# Patient Record
Sex: Female | Born: 1975 | Race: Black or African American | Hispanic: No | Marital: Single | Smoking: Former smoker
Health system: Southern US, Community
[De-identification: ages and names within clinical notes are randomized; demographics above are authoritative.]

## PROBLEM LIST (undated history)

## (undated) HISTORY — PX: TUBAL LIGATION: SHX77

---

## 2005-07-07 ENCOUNTER — Emergency Department: Payer: Self-pay | Admitting: Emergency Medicine

## 2005-08-04 ENCOUNTER — Emergency Department: Payer: Self-pay | Admitting: Emergency Medicine

## 2005-12-17 ENCOUNTER — Emergency Department: Payer: Self-pay | Admitting: Emergency Medicine

## 2006-01-15 ENCOUNTER — Emergency Department: Payer: Self-pay | Admitting: Emergency Medicine

## 2006-04-30 ENCOUNTER — Emergency Department: Payer: Self-pay | Admitting: Emergency Medicine

## 2007-05-15 ENCOUNTER — Emergency Department: Payer: Self-pay | Admitting: Emergency Medicine

## 2007-05-25 ENCOUNTER — Emergency Department: Payer: Self-pay | Admitting: Emergency Medicine

## 2007-06-04 ENCOUNTER — Emergency Department: Payer: Self-pay | Admitting: Emergency Medicine

## 2008-03-01 ENCOUNTER — Emergency Department: Payer: Self-pay | Admitting: Emergency Medicine

## 2008-03-19 ENCOUNTER — Emergency Department: Payer: Self-pay | Admitting: Emergency Medicine

## 2008-03-27 ENCOUNTER — Emergency Department: Payer: Self-pay | Admitting: Emergency Medicine

## 2008-04-10 ENCOUNTER — Emergency Department: Payer: Self-pay | Admitting: Emergency Medicine

## 2008-05-20 ENCOUNTER — Emergency Department: Payer: Self-pay | Admitting: Emergency Medicine

## 2009-06-30 ENCOUNTER — Emergency Department: Payer: Self-pay | Admitting: Emergency Medicine

## 2009-12-19 ENCOUNTER — Emergency Department: Payer: Self-pay | Admitting: Unknown Physician Specialty

## 2009-12-28 ENCOUNTER — Emergency Department: Payer: Self-pay | Admitting: Emergency Medicine

## 2010-01-10 ENCOUNTER — Emergency Department: Payer: Self-pay | Admitting: Emergency Medicine

## 2010-05-23 ENCOUNTER — Emergency Department: Payer: Self-pay | Admitting: Internal Medicine

## 2010-05-30 ENCOUNTER — Emergency Department: Payer: Self-pay | Admitting: Emergency Medicine

## 2010-09-18 ENCOUNTER — Emergency Department: Payer: Self-pay | Admitting: Emergency Medicine

## 2010-09-25 ENCOUNTER — Emergency Department: Payer: Self-pay | Admitting: Emergency Medicine

## 2010-10-26 ENCOUNTER — Emergency Department: Payer: Self-pay | Admitting: Emergency Medicine

## 2011-11-21 ENCOUNTER — Emergency Department: Payer: Self-pay | Admitting: Unknown Physician Specialty

## 2012-03-31 ENCOUNTER — Emergency Department: Payer: Self-pay | Admitting: Emergency Medicine

## 2012-03-31 LAB — URINALYSIS, COMPLETE
Bilirubin,UR: NEGATIVE
Nitrite: NEGATIVE
Ph: 5 (ref 4.5–8.0)
RBC,UR: 3 /HPF (ref 0–5)
Specific Gravity: 1.029 (ref 1.003–1.030)
Squamous Epithelial: 9
WBC UR: 24 /HPF (ref 0–5)

## 2012-03-31 LAB — PREGNANCY, URINE: Pregnancy Test, Urine: NEGATIVE m[IU]/mL

## 2012-05-04 ENCOUNTER — Emergency Department: Payer: Self-pay | Admitting: Emergency Medicine

## 2012-05-04 LAB — PREGNANCY, URINE: Pregnancy Test, Urine: NEGATIVE m[IU]/mL

## 2012-05-04 LAB — URINALYSIS, COMPLETE
Glucose,UR: NEGATIVE mg/dL (ref 0–75)
Ketone: NEGATIVE
Nitrite: NEGATIVE
Ph: 6 (ref 4.5–8.0)
RBC,UR: 5 /HPF (ref 0–5)
Specific Gravity: 1.024 (ref 1.003–1.030)
Squamous Epithelial: 9
WBC UR: 78 /HPF (ref 0–5)

## 2012-05-06 LAB — BETA STREP CULTURE(ARMC)

## 2012-05-17 ENCOUNTER — Emergency Department: Payer: Self-pay | Admitting: Emergency Medicine

## 2012-05-17 LAB — URINALYSIS, COMPLETE
Bilirubin,UR: NEGATIVE
Ketone: NEGATIVE
Nitrite: NEGATIVE
RBC,UR: NONE SEEN /HPF (ref 0–5)
Specific Gravity: 1.025 (ref 1.003–1.030)
Squamous Epithelial: 31

## 2012-05-17 LAB — PREGNANCY, URINE: Pregnancy Test, Urine: NEGATIVE m[IU]/mL

## 2012-05-24 ENCOUNTER — Emergency Department: Payer: Self-pay | Admitting: Emergency Medicine

## 2012-05-25 LAB — MONONUCLEOSIS SCREEN: Mono Test: NEGATIVE

## 2012-07-16 ENCOUNTER — Emergency Department: Payer: Self-pay | Admitting: Emergency Medicine

## 2012-07-16 LAB — URINALYSIS, COMPLETE
Bilirubin,UR: NEGATIVE
Nitrite: NEGATIVE
Ph: 5 (ref 4.5–8.0)
Protein: NEGATIVE
Specific Gravity: 1.028 (ref 1.003–1.030)

## 2012-08-09 ENCOUNTER — Emergency Department: Payer: Self-pay | Admitting: *Deleted

## 2012-08-14 ENCOUNTER — Emergency Department: Payer: Self-pay | Admitting: Emergency Medicine

## 2012-08-18 LAB — WOUND CULTURE

## 2013-04-25 ENCOUNTER — Emergency Department: Payer: Self-pay | Admitting: Emergency Medicine

## 2013-04-29 ENCOUNTER — Emergency Department: Payer: Self-pay | Admitting: Emergency Medicine

## 2013-09-11 ENCOUNTER — Emergency Department: Payer: Self-pay | Admitting: Emergency Medicine

## 2013-11-30 ENCOUNTER — Emergency Department: Payer: Self-pay | Admitting: Emergency Medicine

## 2013-11-30 LAB — URINALYSIS, COMPLETE
Ketone: NEGATIVE
Ph: 6 (ref 4.5–8.0)
Specific Gravity: 1.023 (ref 1.003–1.030)
Squamous Epithelial: 10
WBC UR: 52 /HPF (ref 0–5)

## 2014-01-31 ENCOUNTER — Emergency Department: Payer: Self-pay | Admitting: Emergency Medicine

## 2014-01-31 LAB — URINALYSIS, COMPLETE
BACTERIA: NONE SEEN
Bilirubin,UR: NEGATIVE
Blood: NEGATIVE
GLUCOSE, UR: NEGATIVE mg/dL (ref 0–75)
Ketone: NEGATIVE
NITRITE: POSITIVE
PH: 6 (ref 4.5–8.0)
Protein: 100
RBC,UR: 18 /HPF (ref 0–5)
Specific Gravity: 1.021 (ref 1.003–1.030)

## 2014-03-11 ENCOUNTER — Emergency Department: Payer: Self-pay | Admitting: Internal Medicine

## 2014-03-11 LAB — COMPREHENSIVE METABOLIC PANEL
ALBUMIN: 3.5 g/dL (ref 3.4–5.0)
ALT: 18 U/L (ref 12–78)
Alkaline Phosphatase: 72 U/L
Anion Gap: 2 — ABNORMAL LOW (ref 7–16)
BUN: 8 mg/dL (ref 7–18)
Bilirubin,Total: 0.4 mg/dL (ref 0.2–1.0)
CHLORIDE: 105 mmol/L (ref 98–107)
Calcium, Total: 8.6 mg/dL (ref 8.5–10.1)
Co2: 29 mmol/L (ref 21–32)
Creatinine: 0.82 mg/dL (ref 0.60–1.30)
EGFR (African American): >60
EGFR (Non-African Amer.): >60
GLUCOSE: 97 mg/dL (ref 65–99)
Osmolality: 270 (ref 275–301)
Potassium: 3.9 mmol/L (ref 3.5–5.1)
SGOT(AST): 11 U/L — ABNORMAL LOW (ref 15–37)
SODIUM: 136 mmol/L (ref 136–145)
TOTAL PROTEIN: 8.2 g/dL (ref 6.4–8.2)

## 2014-03-11 LAB — URINALYSIS, COMPLETE
Bilirubin,UR: NEGATIVE
Glucose,UR: NEGATIVE mg/dL (ref 0–75)
KETONE: NEGATIVE
Nitrite: POSITIVE
PH: 7 (ref 4.5–8.0)
RBC,UR: 33 /HPF (ref 0–5)
SPECIFIC GRAVITY: 1.02 (ref 1.003–1.030)
Squamous Epithelial: 7
WBC UR: 274 /HPF (ref 0–5)

## 2014-03-11 LAB — CBC
HCT: 36.4 % (ref 35.0–47.0)
HGB: 12.1 g/dL (ref 12.0–16.0)
MCH: 26.6 pg (ref 26.0–34.0)
MCHC: 33.3 g/dL (ref 32.0–36.0)
MCV: 80 fL (ref 80–100)
PLATELETS: 340 10*3/uL (ref 150–440)
RBC: 4.56 10*6/uL (ref 3.80–5.20)
RDW: 14.4 % (ref 11.5–14.5)
WBC: 9.2 10*3/uL (ref 3.6–11.0)

## 2014-05-28 ENCOUNTER — Emergency Department: Payer: Self-pay | Admitting: Internal Medicine

## 2014-05-28 LAB — COMPREHENSIVE METABOLIC PANEL
Albumin: 3.2 g/dL — ABNORMAL LOW (ref 3.4–5.0)
Alkaline Phosphatase: 80 U/L
Anion Gap: 6 — ABNORMAL LOW (ref 7–16)
BUN: 7 mg/dL (ref 7–18)
Bilirubin,Total: 0.4 mg/dL (ref 0.2–1.0)
CHLORIDE: 105 mmol/L (ref 98–107)
CO2: 27 mmol/L (ref 21–32)
CREATININE: 0.71 mg/dL (ref 0.60–1.30)
Calcium, Total: 8.6 mg/dL (ref 8.5–10.1)
EGFR (African American): >60
EGFR (Non-African Amer.): >60
Glucose: 90 mg/dL (ref 65–99)
OSMOLALITY: 273 (ref 275–301)
POTASSIUM: 3.9 mmol/L (ref 3.5–5.1)
SGOT(AST): 9 U/L — ABNORMAL LOW (ref 15–37)
SGPT (ALT): 15 U/L (ref 12–78)
Sodium: 138 mmol/L (ref 136–145)
Total Protein: 7.3 g/dL (ref 6.4–8.2)

## 2014-05-28 LAB — CBC
HCT: 35.5 % (ref 35.0–47.0)
HGB: 11.7 g/dL — AB (ref 12.0–16.0)
MCH: 26 pg (ref 26.0–34.0)
MCHC: 33 g/dL (ref 32.0–36.0)
MCV: 79 fL — ABNORMAL LOW (ref 80–100)
PLATELETS: 325 10*3/uL (ref 150–440)
RBC: 4.5 10*6/uL (ref 3.80–5.20)
RDW: 14.2 % (ref 11.5–14.5)
WBC: 7.9 10*3/uL (ref 3.6–11.0)

## 2014-05-28 LAB — URINALYSIS, COMPLETE
BILIRUBIN, UR: NEGATIVE
Bacteria: NONE SEEN
Blood: NEGATIVE
GLUCOSE, UR: NEGATIVE mg/dL (ref 0–75)
KETONE: NEGATIVE
Nitrite: NEGATIVE
PH: 7 (ref 4.5–8.0)
RBC,UR: 27 /HPF (ref 0–5)
Specific Gravity: 1.021 (ref 1.003–1.030)
WBC UR: 118 /HPF (ref 0–5)

## 2014-06-27 ENCOUNTER — Emergency Department: Payer: Self-pay | Admitting: Emergency Medicine

## 2014-06-27 LAB — URINALYSIS, COMPLETE
BLOOD: NEGATIVE
Bilirubin,UR: NEGATIVE
Glucose,UR: NEGATIVE mg/dL (ref 0–75)
Ketone: NEGATIVE
NITRITE: NEGATIVE
PH: 6 (ref 4.5–8.0)
Protein: NEGATIVE
Specific Gravity: 1.024 (ref 1.003–1.030)

## 2014-08-09 ENCOUNTER — Emergency Department: Payer: Self-pay | Admitting: Internal Medicine

## 2014-09-15 ENCOUNTER — Emergency Department: Payer: Self-pay | Admitting: Emergency Medicine

## 2015-06-12 ENCOUNTER — Emergency Department
Admission: EM | Admit: 2015-06-12 | Discharge: 2015-06-12 | Disposition: A | Discharge status: Home / Self Care | Payer: No Typology Code available for payment source | Attending: Student | Admitting: Student

## 2015-06-12 DIAGNOSIS — N390 Urinary tract infection, site not specified: Secondary | ICD-10-CM | POA: Diagnosis not present

## 2015-06-12 DIAGNOSIS — L089 Local infection of the skin and subcutaneous tissue, unspecified: Secondary | ICD-10-CM | POA: Diagnosis not present

## 2015-06-12 DIAGNOSIS — Z3202 Encounter for pregnancy test, result negative: Secondary | ICD-10-CM | POA: Insufficient documentation

## 2015-06-12 DIAGNOSIS — R350 Frequency of micturition: Secondary | ICD-10-CM | POA: Diagnosis present

## 2015-06-12 LAB — URINALYSIS COMPLETE WITH MICROSCOPIC (ARMC ONLY)
Bilirubin Urine: NEGATIVE
GLUCOSE, UA: NEGATIVE mg/dL
Ketones, ur: NEGATIVE mg/dL
NITRITE: POSITIVE — AB
Protein, ur: NEGATIVE mg/dL
Specific Gravity, Urine: 1.025 (ref 1.005–1.030)
pH: 6 (ref 5.0–8.0)

## 2015-06-12 LAB — POCT PREGNANCY, URINE: PREG TEST UR: NEGATIVE

## 2015-06-12 MED ORDER — SULFAMETHOXAZOLE-TRIMETHOPRIM 800-160 MG PO TABS
ORAL_TABLET | ORAL | Status: AC
  Administered 2015-06-12: 1 via ORAL
  Filled 2015-06-12: qty 1

## 2015-06-12 MED ORDER — SULFAMETHOXAZOLE-TRIMETHOPRIM 800-160 MG PO TABS
1.0000 | ORAL_TABLET | Freq: Two times a day (BID) | ORAL | Status: DC
  Administered 2015-06-12: 1 via ORAL

## 2015-06-12 MED ORDER — IBUPROFEN 800 MG PO TABS
800.0000 mg | ORAL_TABLET | Freq: Once | ORAL | Status: AC
  Administered 2015-06-12: 800 mg via ORAL

## 2015-06-12 MED ORDER — PHENAZOPYRIDINE HCL 200 MG PO TABS
200.0000 mg | ORAL_TABLET | Freq: Once | ORAL | Status: AC
  Administered 2015-06-12: 200 mg via ORAL

## 2015-06-12 MED ORDER — PHENAZOPYRIDINE HCL 200 MG PO TABS: 200.0000 mg | ORAL_TABLET | Freq: Three times a day (TID) | ORAL | Status: AC | PRN

## 2015-06-12 MED ORDER — IBUPROFEN 800 MG PO TABS
ORAL_TABLET | ORAL | Status: AC
  Administered 2015-06-12: 800 mg via ORAL
  Filled 2015-06-12: qty 1

## 2015-06-12 MED ORDER — SULFAMETHOXAZOLE-TRIMETHOPRIM 800-160 MG PO TABS: 1.0000 | ORAL_TABLET | Freq: Two times a day (BID) | ORAL | Status: DC

## 2015-06-12 MED ORDER — PHENAZOPYRIDINE HCL 200 MG PO TABS
ORAL_TABLET | ORAL | Status: AC
  Administered 2015-06-12: 200 mg via ORAL
  Filled 2015-06-12: qty 1

## 2015-06-12 MED ORDER — IBUPROFEN 800 MG PO TABS: 800.0000 mg | ORAL_TABLET | Freq: Three times a day (TID) | ORAL | Status: DC | PRN

## 2015-06-12 NOTE — ED Notes (Signed)
Pt c/o swollen area to the right upper lateral leg for the past week with drainage, "I think I was bit by a spider'.

## 2015-06-12 NOTE — ED Provider Notes (Signed)
[facility] Emergency Department Provider Note  ____________________________________________  Time seen: 1138   I have reviewed the triage vital signs and the nursing notes.   HISTORY  Chief Complaint Abscess    HPI Rachel Cline is a 39 y.o. female comes here today with an area on her right thigh that she believes to be a spider bite she is also having burning with urination frequency urgency and bad smell overall rates her discomfort as about a 6 out of 10 nothing seems to make it better or worse and no other symptoms of note at this time here today for further evaluation and treatment if needed   No past medical history on file.  There are no active problems to display for this patient.   No past surgical history on file.  Current Outpatient Rx  Name  Route  Sig  Dispense  Refill  . ibuprofen (ADVIL,MOTRIN) 800 MG tablet   Oral   Take 1 tablet (800 mg total) by mouth every 8 (eight) hours as needed.   30 tablet   0   . phenazopyridine (PYRIDIUM) 200 MG tablet   Oral   Take 1 tablet (200 mg total) by mouth 3 (three) times daily as needed for pain.   10 tablet   0   . sulfamethoxazole-trimethoprim (BACTRIM DS,SEPTRA DS) 800-160 MG per tablet   Oral   Take 1 tablet by mouth 2 (two) times daily.   14 tablet   0     Allergies Review of patient's allergies indicates no known allergies.  No family history on file.  Social History History  Substance Use Topics  . Smoking status: Not on file  . Smokeless tobacco: Not on file  . Alcohol Use: Not on file    Review of Systems Constitutional: No fever/chills Eyes: No visual changes. ENT: No sore throat. Cardiovascular: Denies chest pain. Respiratory: Denies shortness of breath. Gastrointestinal: No abdominal pain.  No nausea, no vomiting.  No diarrhea.  No constipation. Genitourinary: Negative for dysuria. Musculoskeletal: Negative for back pain. Skin: Negative for  rash. Neurological: Negative for headaches, focal weakness or numbness.  10-point ROS otherwise negative.  ____________________________________________   PHYSICAL EXAM:  VITAL SIGNS: ED Triage Vitals  Enc Vitals Group     BP 06/12/15 1117 143/98 mmHg     Pulse Rate 06/12/15 1117 70     Resp 06/12/15 1117 18     Temp 06/12/15 1117 98.9 F (37.2 C)     Temp Source 06/12/15 1117 Oral     SpO2 06/12/15 1117 98 %     Weight 06/12/15 1117 250 lb (113.399 kg)     Height 06/12/15 1117  (1.676 m)     Head Cir --      Peak Flow --      Pain Score 06/12/15 1118 6     Pain Loc --      Pain Edu? --      Excl. in GC? --     Constitutional: Alert and oriented. Well appearing and in no acute distress. Eyes: Conjunctivae are normal. PERRL. EOMI. Head: Atraumatic. Nose: No congestion/rhinnorhea. Mouth/Throat: Mucous membranes are moist.  Oropharynx non-erythematous. Neck: No stridor.   Cardiovascular: Normal rate, regular rhythm. Grossly normal heart sounds.  Good peripheral circulation. Respiratory: Normal respiratory effort.  No retractions. Lungs CTAB. Gastrointestinal: Soft and nontender. No distention. No abdominal bruits. No CVA tenderness. Musculoskeletal: No lower extremity tenderness nor edema.  No joint effusions. Neurologic:  Normal speech  and language. No gross focal neurologic deficits are appreciated. Speech is normal. No gait instability. Skin:  Has a swollen tender area to her right thigh no redness or fluctuance seems to be well-healing Psychiatric: Mood and affect are normal. Speech and behavior are normal.  ____________________________________________   LABS (all labs ordered are listed, but only abnormal results are displayed)  Labs Reviewed  URINALYSIS COMPLETEWITH MICROSCOPIC ([facility] ONLY) - Abnormal; Notable for the following:    Color, Urine YELLOW (*)    APPearance CLOUDY (*)    Hgb urine dipstick 1+ (*)    Nitrite POSITIVE (*)    Leukocytes, UA 1+  (*)    Bacteria, UA FEW (*)    Squamous Epithelial / LPF 6-30 (*)    All other components within normal limits  POC URINE PREG, ED  POCT PREGNANCY, URINE     PROCEDURES  Procedure(s) performed: None  Critical Care performed: No  ____________________________________________   INITIAL IMPRESSION / ASSESSMENT AND PLAN / ED COURSE  Pertinent labs & imaging results that were available during my care of the patient were reviewed by me and considered in my medical decision making (see chart for details).  Initial impression on this patient healing soft tissue infection of the right thigh and urinary tract infection will start patient on Bactrim Motrin Pyridium have a follow-up with her regular doctor as needed return here for any acute concerns or worsening symptoms ____________________________________________   FINAL CLINICAL IMPRESSION(S) / ED DIAGNOSES  Final diagnoses:  UTI (lower urinary tract infection)  Soft tissue infection     Shannan Slinker Rosalyn GessWilliam C Jesseka Drinkard, PA-C 06/12/15 1242  Gayla DossEryka A Gayle, MD 06/12/15 1526

## 2015-06-25 ENCOUNTER — Encounter: Payer: Self-pay | Admitting: Medical Oncology

## 2015-06-25 ENCOUNTER — Other Ambulatory Visit: Payer: Self-pay

## 2015-06-25 ENCOUNTER — Emergency Department
Admission: EM | Admit: 2015-06-25 | Discharge: 2015-06-25 | Disposition: A | Discharge status: Home / Self Care | Payer: No Typology Code available for payment source | Attending: Emergency Medicine | Admitting: Emergency Medicine

## 2015-06-25 DIAGNOSIS — R519 Headache, unspecified: Secondary | ICD-10-CM

## 2015-06-25 DIAGNOSIS — F419 Anxiety disorder, unspecified: Secondary | ICD-10-CM | POA: Diagnosis not present

## 2015-06-25 DIAGNOSIS — R42 Dizziness and giddiness: Secondary | ICD-10-CM

## 2015-06-25 DIAGNOSIS — Z792 Long term (current) use of antibiotics: Secondary | ICD-10-CM | POA: Insufficient documentation

## 2015-06-25 DIAGNOSIS — F42 Obsessive-compulsive disorder: Secondary | ICD-10-CM | POA: Diagnosis present

## 2015-06-25 DIAGNOSIS — R51 Headache: Secondary | ICD-10-CM | POA: Insufficient documentation

## 2015-06-25 LAB — CBC
HCT: 34.2 % — ABNORMAL LOW (ref 35.0–47.0)
Hemoglobin: 11.1 g/dL — ABNORMAL LOW (ref 12.0–16.0)
MCH: 25.3 pg — ABNORMAL LOW (ref 26.0–34.0)
MCHC: 32.3 g/dL (ref 32.0–36.0)
MCV: 78.4 fL — AB (ref 80.0–100.0)
Platelets: 332 10*3/uL (ref 150–440)
RBC: 4.37 MIL/uL (ref 3.80–5.20)
RDW: 14.7 % — AB (ref 11.5–14.5)
WBC: 8.7 10*3/uL (ref 3.6–11.0)

## 2015-06-25 LAB — BASIC METABOLIC PANEL
ANION GAP: 8 (ref 5–15)
BUN: 14 mg/dL (ref 6–20)
CALCIUM: 8.8 mg/dL — AB (ref 8.9–10.3)
CO2: 26 mmol/L (ref 22–32)
Chloride: 104 mmol/L (ref 101–111)
Creatinine, Ser: 0.88 mg/dL (ref 0.44–1.00)
GFR calc Af Amer: >60 mL/min (ref >60)
GFR calc non Af Amer: >60 mL/min (ref >60)
GLUCOSE: 90 mg/dL (ref 65–99)
Potassium: 4 mmol/L (ref 3.5–5.1)
Sodium: 138 mmol/L (ref 135–145)

## 2015-06-25 MED ORDER — KETOROLAC TROMETHAMINE 30 MG/ML IJ SOLN
INTRAMUSCULAR | Status: AC
  Filled 2015-06-25: qty 1

## 2015-06-25 MED ORDER — SODIUM CHLORIDE 0.9 % IV SOLN
1000.0000 mL | Freq: Once | INTRAVENOUS | Status: AC
Start: 2015-06-25 — End: 2015-06-25
  Administered 2015-06-25: 1000 mL via INTRAVENOUS

## 2015-06-25 MED ORDER — KETOROLAC TROMETHAMINE 30 MG/ML IJ SOLN
30.0000 mg | Freq: Once | INTRAMUSCULAR | Status: AC
  Administered 2015-06-25: 30 mg via INTRAVENOUS

## 2015-06-25 NOTE — Discharge Instructions (Signed)

## 2015-06-25 NOTE — ED Provider Notes (Signed)
[facility] Emergency Department Provider Note  ____________________________________________  Time seen: On arrival  I have reviewed the triage vital signs and the nursing notes.   HISTORY  Chief Complaint Dizziness and Headache    HPI Rachel Cline is a 39 y.o. female who presents with complaints of lightheadedness and headache that started yesterday but has improved. She reports headache was global and throbbing in nature and occurred after an episode of lightheadedness which resolved with sitting down. She denies fevers chills. No neck pain. This is not the worst headache of her life in fact is improved immensely. No focal deficits. No IV drug abuse.No sick contacts, no visual changes     History reviewed. No pertinent past medical history.  There are no active problems to display for this patient.   Past Surgical History  Procedure Laterality Date  . Cesarean section    . Tubal ligation      Current Outpatient Rx  Name  Route  Sig  Dispense  Refill  . ibuprofen (ADVIL,MOTRIN) 200 MG tablet   Oral   Take 600 mg by mouth every 6 (six) hours as needed for headache.         . phenazopyridine (PYRIDIUM) 200 MG tablet   Oral   Take 1 tablet (200 mg total) by mouth 3 (three) times daily as needed for pain.   10 tablet   0   . sulfamethoxazole-trimethoprim (BACTRIM DS,SEPTRA DS) 800-160 MG per tablet   Oral   Take 1 tablet by mouth 2 (two) times daily.   14 tablet   0   . ibuprofen (ADVIL,MOTRIN) 800 MG tablet   Oral   Take 1 tablet (800 mg total) by mouth every 8 (eight) hours as needed.   30 tablet   0     Allergies Review of patient's allergies indicates no known allergies.  No family history on file.  Social History History  Substance Use Topics  . Smoking status: Never Smoker   . Smokeless tobacco: Not on file  . Alcohol Use: Yes     Comment: occ    Review of Systems  Constitutional: Negative for fever. Eyes:  Negative for visual changes. ENT: Negative for sore throat Cardiovascular: Negative for chest pain. Respiratory: Negative for shortness of breath. Gastrointestinal: Negative for abdominal pain, vomiting and diarrhea. Genitourinary: Negative for dysuria. Musculoskeletal: Negative for back pain. Skin: Negative for rash. Neurological: Positive for headache Psychiatric: Positive for anxiety  10-point ROS otherwise negative.  ____________________________________________   PHYSICAL EXAM:  VITAL SIGNS: ED Triage Vitals  Enc Vitals Group     BP 06/25/15 0923 136/100 mmHg     Pulse Rate 06/25/15 0923 65     Resp 06/25/15 0923 14     Temp --      Temp src --      SpO2 06/25/15 0923 97 %     Weight --      Height --      Head Cir --      Peak Flow --      Pain Score 06/25/15 0842 8     Pain Loc --      Pain Edu? --      Excl. in GC? --      Constitutional: Alert and oriented. Well appearing and in no distress. Eyes: Conjunctivae are normal.  ENT   Head: Normocephalic and atraumatic.   Mouth/Throat: Mucous membranes are moist. Cardiovascular: Normal rate, regular rhythm. Normal and symmetric distal pulses are  present in all extremities. No murmurs, rubs, or gallops. Respiratory: Normal respiratory effort without tachypnea nor retractions. Breath sounds are clear and equal bilaterally.  Gastrointestinal: Soft and non-tender in all quadrants. No distention. There is no CVA tenderness. Genitourinary: deferred Musculoskeletal: Nontender with normal range of motion in all extremities. No lower extremity tenderness nor edema. Neurologic:  Normal speech and language. No gross focal neurologic deficits are appreciated. Skin:  Skin is warm, dry and intact. No rash noted. Psychiatric: Mood and affect are normal. Patient exhibits appropriate insight and judgment.  ____________________________________________    LABS (pertinent positives/negatives)  Labs Reviewed  CBC -  Abnormal; Notable for the following:    Hemoglobin 11.1 (*)    HCT 34.2 (*)    MCV 78.4 (*)    MCH 25.3 (*)    RDW 14.7 (*)    All other components within normal limits  BASIC METABOLIC PANEL - Abnormal; Notable for the following:    Calcium 8.8 (*)    All other components within normal limits  POC URINE PREG, ED  CBG MONITORING, ED    ____________________________________________   EKG  ED ECG REPORT I, Jene Every, the attending physician, personally viewed and interpreted this ECG.  Date: 06/25/2015 EKG Time: 8:48 AM Rate: 70 Rhythm: normal sinus rhythm QRS Axis: normal Intervals: normal ST/T Wave abnormalities: normal Conduction Disutrbances: none Narrative Interpretation: unremarkable   ____________________________________________    RADIOLOGY I have personally reviewed any xrays that were ordered on this patient: None  ____________________________________________   PROCEDURES  Procedure(s) performed: none  Critical Care performed: none  ____________________________________________   INITIAL IMPRESSION / ASSESSMENT AND PLAN / ED COURSE  Pertinent labs & imaging results that were available during my care of the patient were reviewed by me and considered in my medical decision making (see chart for details).  Patient extremely well-appearing and in no acute distress. Vital signs normal. Benign exam. Normal fundus bilaterally. We will treat with IV fluids and IV Toradol after verifying negative pregnancy(patient just finished normal menstrual cycle).  ----------------------------------------- 11:31 AM on 06/25/2015 -----------------------------------------  Patient improved after the Toradol and anxious to go home. Hemoglobin not significant change from prior test. Recommend follow-up with PCP. Return precautions given ____________________________________________   FINAL CLINICAL IMPRESSION(S) / ED DIAGNOSES  Final diagnoses:  Acute  nonintractable headache, unspecified headache type  Dizziness     Jene Every, MD 06/25/15 1132

## 2015-06-25 NOTE — ED Notes (Signed)
Pt ambulatory to triage with reports that she began feeling light headed yesterday with headache, has continued to worsen.

## 2016-10-22 ENCOUNTER — Emergency Department
Admission: EM | Admit: 2016-10-22 | Discharge: 2016-10-22 | Disposition: A | Discharge status: Home / Self Care | Payer: No Typology Code available for payment source | Attending: Emergency Medicine | Admitting: Emergency Medicine

## 2016-10-22 DIAGNOSIS — M722 Plantar fascial fibromatosis: Secondary | ICD-10-CM | POA: Insufficient documentation

## 2016-10-22 MED ORDER — METHYLPREDNISOLONE SODIUM SUCC 125 MG IJ SOLR
125.0000 mg | Freq: Once | INTRAMUSCULAR | Status: AC
  Administered 2016-10-22: 125 mg via INTRAVENOUS
  Filled 2016-10-22: qty 2

## 2016-10-22 MED ORDER — METHYLPREDNISOLONE 4 MG PO TBPK: ORAL_TABLET | ORAL | 0 refills | Status: DC

## 2016-10-22 NOTE — ED Notes (Signed)
States she is having pain to bottom of right foot    Pain is mainly at heel and radiates into back of leg  Pain is worse when first getting up in the mornings  Denies any injury

## 2016-10-22 NOTE — ED Provider Notes (Signed)
[facility] Emergency Department Provider Note   ____________________________________________   None    (approximate)  I have reviewed the triage vital signs and the nursing notes.   HISTORY  Chief Complaint Ankle Pain    HPI Rachel Cline is a 40 y.o. female patient complain of posterior right ankle pain and it radiates to the knees. Patient state onset 2-3 days. Patient denies any injury. Patient stated pain is worse in the morning and improves stone in the afternoon. Patient rates the pain as a 8/10. Patient described a pain as "achy". No palliative measures taken for this complaint.  History reviewed. No pertinent past medical history.  There are no active problems to display for this patient.   Past Surgical History:  Procedure Laterality Date  . CESAREAN SECTION    . TUBAL LIGATION      Prior to Admission medications   Medication Sig Start Date End Date Taking? Authorizing Provider  methylPREDNISolone (MEDROL DOSEPAK) 4 MG TBPK tablet Take Tapered dose as directed 10/22/16   Joni Reiningonald K Smith, PA-C    Allergies Patient has no known allergies.  No family history on file.  Social History Social History  Substance Use Topics  . Smoking status: Never Smoker  . Smokeless tobacco: Never Used  . Alcohol use Yes     Comment: occ    Review of Systems Constitutional: No fever/chills Eyes: No visual changes. ENT: No sore throat. Cardiovascular: Denies chest pain. Respiratory: Denies shortness of breath. Gastrointestinal: No abdominal pain.  No nausea, no vomiting.  No diarrhea.  No constipation. Genitourinary: Negative for dysuria. Musculoskeletal: Right posterior heel pain Skin: Negative for rash. Neurological: Negative for headaches, focal weakness or numbness. .  ____________________________________________   PHYSICAL EXAM:  VITAL SIGNS: ED Triage Vitals  Enc Vitals Group     BP 10/22/16 0828 (!) 144/87     Pulse Rate  10/22/16 0827 85     Resp 10/22/16 0827 18     Temp 10/22/16 0827 97.8 F (36.6 C)     Temp Source 10/22/16 0827 Oral     SpO2 10/22/16 0827 99 %     Weight 10/22/16 0826 230 lb (104.3 kg)     Height 10/22/16 0826 5\' 6"  (1.676 m)     Head Circumference --      Peak Flow --      Pain Score 10/22/16 0826 8     Pain Loc --      Pain Edu? --      Excl. in GC? --     Constitutional: Alert and oriented. Well appearing and in no acute distress. Eyes: Conjunctivae are normal. PERRL. EOMI. Head: Atraumatic. Nose: No congestion/rhinnorhea. Mouth/Throat: Mucous membranes are moist.  Oropharynx non-erythematous. Neck: No stridor.  No cervical spine tenderness to palpation. Hematological/Lymphatic/Immunilogical: No cervical lymphadenopathy. }Cardiovascular: Normal rate, regular rhythm. Grossly normal heart sounds.  Good peripheral circulation. Respiratory: Normal respiratory effort.  No retractions. Lungs CTAB. Gastrointestinal: Soft and nontender. No distention. No abdominal bruits. No CVA tenderness. Genitourinary:  Musculoskeletal:Bilateral bilateral Pes Planus. Moderate guarding palpation of the plantar aspect of the heel.  Neurologic:  Normal speech and language. No gross focal neurologic deficits are appreciated. No gait instability. Skin:  Skin is warm, dry and intact. No rash noted. Psychiatric: Mood and affect are normal. Speech and behavior are normal.  ____________________________________________   LABS (all labs ordered are listed, but only abnormal results are displayed)  Labs Reviewed - No data to display ____________________________________________  EKG   ____________________________________________  RADIOLOGY   ____________________________________________   PROCEDURES  Procedure(s) performed: None  Procedures  Critical Care performed: No  ____________________________________________   INITIAL IMPRESSION / ASSESSMENT AND PLAN / ED COURSE  Pertinent  labs & imaging results that were available during my care of the patient were reviewed by me and considered in my medical decision making (see chart for details).  Plantar fasciitis. Patient given discharge care instructions. Patient given a prescription for Medrol Dosepak. Patient follow up with podiatry for definitive evaluation and treatment.  Clinical Course      ____________________________________________   FINAL CLINICAL IMPRESSION(S) / ED DIAGNOSES  Final diagnoses:  Plantar fasciitis      NEW MEDICATIONS STARTED DURING THIS VISIT:  New Prescriptions   METHYLPREDNISOLONE (MEDROL DOSEPAK) 4 MG TBPK TABLET    Take Tapered dose as directed     Note:  This document was prepared using Dragon voice recognition software and may include unintentional dictation errors.    Joni Reiningonald K Smith, PA-C 10/22/16 [id number]    Jene Everyobert Kinner, MD 10/22/16 [phone]

## 2016-10-22 NOTE — ED Triage Notes (Signed)
Pt c/o right ankle pain that radiates into the knee for the past 2-3 days.. Denies injury..Marland Kitchen

## 2017-03-14 ENCOUNTER — Emergency Department
Admission: EM | Admit: 2017-03-14 | Discharge: 2017-03-14 | Disposition: A | Discharge status: Home / Self Care | Payer: No Typology Code available for payment source | Attending: Emergency Medicine | Admitting: Emergency Medicine

## 2017-03-14 DIAGNOSIS — R030 Elevated blood-pressure reading, without diagnosis of hypertension: Secondary | ICD-10-CM | POA: Insufficient documentation

## 2017-03-14 DIAGNOSIS — K029 Dental caries, unspecified: Secondary | ICD-10-CM | POA: Insufficient documentation

## 2017-03-14 DIAGNOSIS — N3000 Acute cystitis without hematuria: Secondary | ICD-10-CM | POA: Insufficient documentation

## 2017-03-14 LAB — URINALYSIS, COMPLETE (UACMP) WITH MICROSCOPIC
BILIRUBIN URINE: NEGATIVE
Glucose, UA: NEGATIVE mg/dL
KETONES UR: NEGATIVE mg/dL
Nitrite: NEGATIVE
Protein, ur: 30 mg/dL — AB
Specific Gravity, Urine: 1.018 (ref 1.005–1.030)
pH: 7 (ref 5.0–8.0)

## 2017-03-14 LAB — PREGNANCY, URINE: Preg Test, Ur: NEGATIVE

## 2017-03-14 MED ORDER — IBUPROFEN 600 MG PO TABS: 600.0000 mg | ORAL_TABLET | Freq: Three times a day (TID) | ORAL | 0 refills | Status: DC | PRN

## 2017-03-14 MED ORDER — CEPHALEXIN 500 MG PO CAPS: 500.0000 mg | ORAL_CAPSULE | Freq: Three times a day (TID) | ORAL | 0 refills | Status: DC

## 2017-03-14 NOTE — ED Notes (Signed)
Patient states that she fell down 4 steps "on her butt" c/o bilateral low back pain. Patient states that she is also concerned about pain in the bilateral upper gums pertaining to #6 & #11 (canines). Patient states that it will 'swell then go down'. Patient states she has an appointment at [facility] clinic Tuesday.

## 2017-03-14 NOTE — ED Triage Notes (Addendum)
Pt fell last night and is now co low back pain, also has toothache. Pt also co urinary frequency.

## 2017-03-14 NOTE — ED Provider Notes (Signed)
[facility] Emergency Department Provider Note  ____________________________________________   First MD Initiated Contact with Patient 03/14/17 [phone]     (approximate)  I have reviewed the triage vital signs and the nursing notes.   HISTORY  Chief Complaint Back Pain    HPI Rachel Cline is a 41 y.o. female is complaining of low back pain and urinary frequency. Patient states it is been approximately 2-3 years that she has had a urinary tract infection. She denies any fever or chills. There's been no nausea or vomiting. Patient also complains of a toothache. She states that she has an appointment with her dentist in [place] on April 2. Blood pressure was elevated in the department. Patient states that she does not have a history of hypertension. She is unaware of any family members with hypertension. She denies any symptoms. She rates her pain as an 8/10.   No past medical history on file.  There are no active problems to display for this patient.   Past Surgical History:  Procedure Laterality Date  . CESAREAN SECTION    . TUBAL LIGATION      Prior to Admission medications   Medication Sig Start Date End Date Taking? Authorizing Provider  cephALEXin (KEFLEX) 500 MG capsule Take 1 capsule (500 mg total) by mouth 3 (three) times daily. 03/14/17   Tommi Rumps, PA-C  ibuprofen (ADVIL,MOTRIN) 600 MG tablet Take 1 tablet (600 mg total) by mouth every 8 (eight) hours as needed. 03/14/17   Tommi Rumps, PA-C  methylPREDNISolone (MEDROL DOSEPAK) 4 MG TBPK tablet Take Tapered dose as directed 10/22/16   Joni Reining, PA-C    Allergies Patient has no known allergies.  No family history on file.  Social History Social History  Substance Use Topics  . Smoking status: Never Smoker  . Smokeless tobacco: Never Used  . Alcohol use Yes     Comment: occ    Review of Systems Constitutional: No fever/chills Eyes: No visual changes. ENT:  Positive for dental pain. Cardiovascular: Denies chest pain. Respiratory: Denies shortness of breath. Gastrointestinal: No abdominal pain.  No nausea, no vomiting.   Genitourinary: Positive dysuria. Musculoskeletal: Positive for low back pain. Skin: Negative for rash. Neurological: Negative for headaches, focal weakness or numbness.  10-point ROS otherwise negative.  ____________________________________________   PHYSICAL EXAM:  VITAL SIGNS: ED Triage Vitals [03/14/17 0528]  Enc Vitals Group     BP (!) 151/97     Pulse Rate 80     Resp 18     Temp 98.2 F (36.8 C)     Temp Source Oral     SpO2 97 %     Weight 255 lb (115.7 kg)     Height  (1.676 m)     Head Circumference      Peak Flow      Pain Score 8     Pain Loc      Pain Edu?      Excl. in GC?     Constitutional: Alert and oriented. Well appearing and in no acute distress. Eyes: Conjunctivae are normal. PERRL. EOMI. Head: Atraumatic. Nose: No congestion/rhinnorhea. Mouth/Throat: Mucous membranes are moist.  Oropharynx non-erythematous.  Patient has poorly repaired dentition. Upper gums are swollen and bilateral teeth are in poor repair and hygiene. Gums are tender to touch with a tongue depressor. No actual abscess is seen and no drainage noted. Neck: No stridor.   Hematological/Lymphatic/Immunilogical: No cervical lymphadenopathy. Cardiovascular: Normal rate,  regular rhythm. Grossly normal heart sounds.  Good peripheral circulation. Respiratory: Normal respiratory effort.  No retractions. Lungs CTAB. Musculoskeletal: On examination the back there is no gross deformity noted. Range of motion is slightly restricted secondary discomfort however patient is able to ambulate without any assistance and has no difficulty with movement in the exam room. Neurologic:  Normal speech and language. No gross focal neurologic deficits are appreciated. No gait instability. Skin:  Skin is warm, dry and intact. Psychiatric:  Mood and affect are normal. Speech and behavior are normal.  ____________________________________________   LABS (all labs ordered are listed, but only abnormal results are displayed)  Labs Reviewed  URINALYSIS, COMPLETE (UACMP) WITH MICROSCOPIC - Abnormal; Notable for the following:       Result Value   Color, Urine YELLOW (*)    APPearance CLOUDY (*)    Hgb urine dipstick MODERATE (*)    Protein, ur 30 (*)    Leukocytes, UA LARGE (*)    Bacteria, UA RARE (*)    Squamous Epithelial / LPF 6-30 (*)    All other components within normal limits  PREGNANCY, URINE    PROCEDURES  Procedure(s) performed: None  Procedures  Critical Care performed: No  ____________________________________________   INITIAL IMPRESSION / ASSESSMENT AND PLAN / ED COURSE  Pertinent labs & imaging results that were available during my care of the patient were reviewed by me and considered in my medical decision making (see chart for details).  Patient was made aware that she does have a urinary tract infection. We also discussed her upcoming dental appointment in [facility] and encouraged her to keep this. We also discussed her elevated blood pressure in the department. She is unaware of any history of hypertension both for her or her family. She will follow-up with her PCP [facility] for recheck of her blood pressure. Patient is given a prescription for Keflex 500 mg 3 times a day for 10 days. She is to take ibuprofen as needed for pain. Increase fluids.      ____________________________________________   FINAL CLINICAL IMPRESSION(S) / ED DIAGNOSES  Final diagnoses:  Acute cystitis without hematuria  Blood pressure elevated without history of HTN  Pain due to dental caries      NEW MEDICATIONS STARTED DURING THIS VISIT:  Discharge Medication List as of 03/14/2017  7:35 AM    START taking these medications   Details  cephALEXin (KEFLEX) 500 MG capsule Take 1 capsule (500 mg total) by  mouth 3 (three) times daily., Starting Fri 03/14/2017, Print    ibuprofen (ADVIL,MOTRIN) 600 MG tablet Take 1 tablet (600 mg total) by mouth every 8 (eight) hours as needed., Starting Fri 03/14/2017, Print         Note:  This document was prepared using Dragon voice recognition software and may include unintentional dictation errors.    Tommi Rumps, PA-C 03/14/17 1309    Arnaldo Natal, MD 03/14/17 [phone]

## 2017-03-14 NOTE — Discharge Instructions (Signed)
Follow-up with [facility] clinic acute care or doctor of your choice.  Take Keflex 500 mg 3 times a day for 10 days. Ibuprofen as needed for pain. Increase fluids. Today in the emergency room and blood pressure was elevated. Your blood pressure should be rechecked in one week and if continued elevation be treated for hypertension. Keep your Appointment with the dentist on Tuesday.

## 2017-09-27 ENCOUNTER — Emergency Department
Admission: EM | Admit: 2017-09-27 | Discharge: 2017-09-27 | Disposition: A | Discharge status: Home / Self Care | Payer: Self-pay | Attending: Emergency Medicine | Admitting: Emergency Medicine

## 2017-09-27 ENCOUNTER — Encounter: Payer: Self-pay | Admitting: Emergency Medicine

## 2017-09-27 ENCOUNTER — Emergency Department: Payer: Self-pay

## 2017-09-27 DIAGNOSIS — R1010 Upper abdominal pain, unspecified: Secondary | ICD-10-CM | POA: Insufficient documentation

## 2017-09-27 DIAGNOSIS — Z79899 Other long term (current) drug therapy: Secondary | ICD-10-CM | POA: Insufficient documentation

## 2017-09-27 DIAGNOSIS — R112 Nausea with vomiting, unspecified: Secondary | ICD-10-CM | POA: Insufficient documentation

## 2017-09-27 LAB — CBC
HEMATOCRIT: 36.6 % (ref 35.0–47.0)
Hemoglobin: 12 g/dL (ref 12.0–16.0)
MCH: 25.7 pg — ABNORMAL LOW (ref 26.0–34.0)
MCHC: 32.8 g/dL (ref 32.0–36.0)
MCV: 78.2 fL — ABNORMAL LOW (ref 80.0–100.0)
PLATELETS: 393 10*3/uL (ref 150–440)
RBC: 4.68 MIL/uL (ref 3.80–5.20)
RDW: 15.3 % — AB (ref 11.5–14.5)
WBC: 13.1 10*3/uL — AB (ref 3.6–11.0)

## 2017-09-27 LAB — URINALYSIS, COMPLETE (UACMP) WITH MICROSCOPIC
Bilirubin Urine: NEGATIVE
GLUCOSE, UA: NEGATIVE mg/dL
HGB URINE DIPSTICK: NEGATIVE
Ketones, ur: NEGATIVE mg/dL
LEUKOCYTES UA: NEGATIVE
NITRITE: NEGATIVE
PROTEIN: 30 mg/dL — AB
SPECIFIC GRAVITY, URINE: 1.025 (ref 1.005–1.030)
pH: 8 (ref 5.0–8.0)

## 2017-09-27 LAB — COMPREHENSIVE METABOLIC PANEL
ALBUMIN: 3.5 g/dL (ref 3.5–5.0)
ALK PHOS: 51 U/L (ref 38–126)
ALT: 14 U/L (ref 14–54)
AST: 16 U/L (ref 15–41)
Anion gap: 5 (ref 5–15)
BILIRUBIN TOTAL: 0.3 mg/dL (ref 0.3–1.2)
BUN: 13 mg/dL (ref 6–20)
CALCIUM: 8.9 mg/dL (ref 8.9–10.3)
CO2: 25 mmol/L (ref 22–32)
CREATININE: 0.83 mg/dL (ref 0.44–1.00)
Chloride: 109 mmol/L (ref 101–111)
GFR calc Af Amer: >60 mL/min (ref >60)
GLUCOSE: 114 mg/dL — AB (ref 65–99)
Potassium: 4.3 mmol/L (ref 3.5–5.1)
Sodium: 139 mmol/L (ref 135–145)
TOTAL PROTEIN: 6.8 g/dL (ref 6.5–8.1)

## 2017-09-27 LAB — POCT PREGNANCY, URINE: PREG TEST UR: NEGATIVE

## 2017-09-27 LAB — LIPASE, BLOOD: LIPASE: 27 U/L (ref 11–51)

## 2017-09-27 LAB — TROPONIN I

## 2017-09-27 MED ORDER — MORPHINE SULFATE (PF) 4 MG/ML IV SOLN
4.0000 mg | Freq: Once | INTRAVENOUS | Status: AC
  Administered 2017-09-27: 4 mg via INTRAVENOUS
  Filled 2017-09-27: qty 1

## 2017-09-27 MED ORDER — METOCLOPRAMIDE HCL 5 MG/ML IJ SOLN
INTRAMUSCULAR | Status: AC
  Administered 2017-09-27: 10 mg via INTRAVENOUS
  Filled 2017-09-27: qty 2

## 2017-09-27 MED ORDER — SODIUM CHLORIDE 0.9 % IV BOLUS (SEPSIS)
1000.0000 mL | Freq: Once | INTRAVENOUS | Status: AC
  Administered 2017-09-27: 1000 mL via INTRAVENOUS

## 2017-09-27 MED ORDER — ONDANSETRON HCL 4 MG/2ML IJ SOLN: 4.0000 mg | Freq: Once | INTRAMUSCULAR | Status: DC

## 2017-09-27 MED ORDER — FENTANYL CITRATE (PF) 100 MCG/2ML IJ SOLN
50.0000 ug | INTRAMUSCULAR | Status: AC | PRN
  Administered 2017-09-27 (×2): 50 ug via INTRAVENOUS
  Filled 2017-09-27: qty 2

## 2017-09-27 MED ORDER — HYDROMORPHONE HCL 1 MG/ML IJ SOLN
INTRAMUSCULAR | Status: AC
  Administered 2017-09-27: 1 mg via INTRAVENOUS
  Filled 2017-09-27: qty 1

## 2017-09-27 MED ORDER — RANITIDINE HCL 150 MG PO TABS: 150.0000 mg | ORAL_TABLET | Freq: Two times a day (BID) | ORAL | 0 refills | Status: AC

## 2017-09-27 MED ORDER — ONDANSETRON HCL 4 MG/2ML IJ SOLN
4.0000 mg | Freq: Once | INTRAMUSCULAR | Status: AC
  Administered 2017-09-27: 4 mg via INTRAVENOUS
  Filled 2017-09-27: qty 2

## 2017-09-27 MED ORDER — DICYCLOMINE HCL 20 MG PO TABS: 20.0000 mg | ORAL_TABLET | Freq: Three times a day (TID) | ORAL | 0 refills | Status: AC | PRN

## 2017-09-27 MED ORDER — ONDANSETRON HCL 4 MG PO TABS: 4.0000 mg | ORAL_TABLET | Freq: Every day | ORAL | 0 refills | Status: AC | PRN

## 2017-09-27 MED ORDER — METOCLOPRAMIDE HCL 5 MG/ML IJ SOLN
10.0000 mg | Freq: Once | INTRAMUSCULAR | Status: AC
  Administered 2017-09-27: 10 mg via INTRAVENOUS

## 2017-09-27 MED ORDER — IOPAMIDOL (ISOVUE-300) INJECTION 61%
30.0000 mL | Freq: Once | INTRAVENOUS | Status: AC | PRN
  Administered 2017-09-27: 30 mL via ORAL

## 2017-09-27 MED ORDER — HYDROMORPHONE HCL 1 MG/ML IJ SOLN
1.0000 mg | Freq: Once | INTRAMUSCULAR | Status: AC
  Administered 2017-09-27: 1 mg via INTRAVENOUS

## 2017-09-27 MED ORDER — IOPAMIDOL (ISOVUE-300) INJECTION 61%
100.0000 mL | Freq: Once | INTRAVENOUS | Status: AC | PRN
  Administered 2017-09-27: 100 mL via INTRAVENOUS

## 2017-09-27 MED ORDER — ONDANSETRON 4 MG PO TBDP
4.0000 mg | ORAL_TABLET | Freq: Once | ORAL | Status: AC | PRN
  Administered 2017-09-27: 4 mg via ORAL
  Filled 2017-09-27: qty 1

## 2017-09-27 NOTE — ED Provider Notes (Addendum)
[facility] Emergency Department Provider Note  ____________________________________________   First MD Initiated Contact with Patient 09/27/17 0901     (approximate)  I have reviewed the triage vital signs and the nursing notes.   HISTORY  Chief Complaint Emesis and Abdominal Pain    HPI Rachel Cline is a 41 y.o. female with a history of tubal ligation and cesarean section is presenting to the emergency department today with sudden onset upper abdominal pain that started at about 1:30 AM. It has been associated with multiple episodes of nonbloody vomit. Denies any diarrhea. Says the pain is a 10 out of 10 and sharp pain to the middle of the upper abdomen. It is nonradiating. The patient does not report any chest pain or shortness of breath. Says that she has not had anything like this before. Still has her gallbladder. Denies any drinking or drug use.denies any vaginal bleeding or discharge or dysuria.patient without any history of kidney stones. Denies any known family history of kidney stones.   History reviewed. No pertinent past medical history.  There are no active problems to display for this patient.   Past Surgical History:  Procedure Laterality Date  . CESAREAN SECTION    . TUBAL LIGATION      Prior to Admission medications   Medication Sig Start Date End Date Taking? Authorizing Provider  cephALEXin (KEFLEX) 500 MG capsule Take 1 capsule (500 mg total) by mouth 3 (three) times daily. 03/14/17   Tommi Rumps, PA-C  ibuprofen (ADVIL,MOTRIN) 600 MG tablet Take 1 tablet (600 mg total) by mouth every 8 (eight) hours as needed. 03/14/17   Tommi Rumps, PA-C  methylPREDNISolone (MEDROL DOSEPAK) 4 MG TBPK tablet Take Tapered dose as directed 10/22/16   Joni Reining, PA-C    Allergies Patient has no known allergies.  No family history on file.  Social History Social History  Substance Use Topics  . Smoking status: Never Smoker    . Smokeless tobacco: Never Used  . Alcohol use Yes     Comment: occ    Review of Systems  Constitutional: No fever/chills Eyes: No visual changes. ENT: No sore throat. Cardiovascular: Denies chest pain. Respiratory: Denies shortness of breath. Gastrointestinal:  No diarrhea.  No constipation. Genitourinary: Negative for dysuria. Musculoskeletal: Negative for back pain. Skin: Negative for rash. Neurological: Negative for headaches, focal weakness or numbness.   ____________________________________________   PHYSICAL EXAM:  VITAL SIGNS: ED Triage Vitals  Enc Vitals Group     BP 09/27/17 0602 (!) 151/90     Pulse Rate 09/27/17 0602 79     Resp 09/27/17 0602 (!) 22     Temp 09/27/17 0602 98.1 F (36.7 C)     Temp Source 09/27/17 0602 Oral     SpO2 09/27/17 0602 98 %     Weight 09/27/17 0603 256 lb (116.1 kg)     Height 09/27/17 0603  (1.676 m)     Head Circumference --      Peak Flow --      Pain Score 09/27/17 0602 10     Pain Loc --      Pain Edu? --      Excl. in GC? --     Constitutional: Alert and oriented. patient appears uncomfortable. She is sitting on the side of the bed and holding her upper abdomen. Eyes: Conjunctivae are normal.  Head: Atraumatic. Nose: No congestion/rhinnorhea. Mouth/Throat: Mucous membranes are moist.  Neck: No stridor.  Cardiovascular: Normal rate, regular rhythm. Grossly normal heart sounds.   Respiratory: Normal respiratory effort.  No retractions. Lungs CTAB. Gastrointestinal: Soft with moderate tenderness to the epigastrium but without any right upper quadrant or right lower quadrant tenderness to palpation. There is a negative Murphy sign. No distention. No CVA tenderness. Musculoskeletal: No lower extremity tenderness nor edema.  No joint effusions. Neurologic:  Normal speech and language. No gross focal neurologic deficits are appreciated. Skin:  Skin is warm, dry and intact. No rash noted. Psychiatric: Mood and affect  are normal. Speech and behavior are normal.  ____________________________________________   LABS (all labs ordered are listed, but only abnormal results are displayed)  Labs Reviewed  COMPREHENSIVE METABOLIC PANEL - Abnormal; Notable for the following:       Result Value   Glucose, Bld 114 (*)    All other components within normal limits  CBC - Abnormal; Notable for the following:    WBC 13.1 (*)    MCV 78.2 (*)    MCH 25.7 (*)    RDW 15.3 (*)    All other components within normal limits  URINALYSIS, COMPLETE (UACMP) WITH MICROSCOPIC - Abnormal; Notable for the following:    Color, Urine YELLOW (*)    APPearance CLOUDY (*)    Protein, ur 30 (*)    Bacteria, UA RARE (*)    Squamous Epithelial / LPF 6-30 (*)    All other components within normal limits  LIPASE, BLOOD  TROPONIN I  POC URINE PREG, ED  POCT PREGNANCY, URINE   ____________________________________________  EKG  ED ECG REPORT I, Arelia Longest, the attending physician, personally viewed and interpreted this ECG.   Date: 09/27/2017  EKG Time: 603  Rate: 78  Rhythm: normal sinus rhythm  Axis: normal  Intervals:none  ST&T Change: no ST segment elevation or depression. No abnormal T-wave inversion.  ____________________________________________  RADIOLOGY  no acute finding on the CT of the abdomen and pelvis. ____________________________________________   PROCEDURES  Procedure(s) performed: None  Procedures  Critical Care performed: No  ____________________________________________   INITIAL IMPRESSION / ASSESSMENT AND PLAN / ED COURSE  Pertinent labs & imaging results that were available during my care of the patient were reviewed by me and considered in my medical decision making (see chart for details).  Differential diagnosis includes, but is not limited to, biliary disease (biliary colic, acute cholecystitis, cholangitis, choledocholithiasis, etc), intrathoracic causes for epigastric  abdominal pain including ACS, gastritis, duodenitis, pancreatitis, small bowel or large bowel obstruction, abdominal aortic aneurysm, hernia, and gastritis.  As part of my medical decision making, I reviewed the following data within the electronic MEDICAL RECORD NUMBER Notes from prior ED visits   ----------------------------------------- 1:08 PM on 09/27/2017 -----------------------------------------  Patient at this time no longer has vomiting. Abdomen is nontender. Reassuring CAT scan. No complaints at this time. PERC negative.  we discussed the imaging as well as lab results as well as the need for follow-up. The patient's understanding of the plan and willing to comply.     ____________________________________________   FINAL CLINICAL IMPRESSION(S) / ED DIAGNOSES  upper abdominal pain with nausea and vomiting.    NEW MEDICATIONS STARTED DURING THIS VISIT:  New Prescriptions   No medications on file     Note:  This document was prepared using Dragon voice recognition software and may include unintentional dictation errors.     Myrna Blazer, MD 09/27/17 1309    Pershing Proud Myra Rude, MD 09/27/17 [phone]

## 2017-09-27 NOTE — ED Notes (Signed)
Pt vomiting at this time in triage.

## 2017-09-27 NOTE — ED Notes (Signed)
Pt sleeping. 

## 2017-09-27 NOTE — ED Notes (Signed)
MD at bedside. 

## 2017-09-27 NOTE — ED Notes (Signed)
States nausea decreased however pain returning, remed for pain.

## 2017-09-27 NOTE — ED Notes (Signed)
After receiving meds, pt went to sleep, sats dropped, pt easily arousable, when awake, pt states pain 10/10.  Pt also noted dry heaving in room.  No vomitus noted.

## 2017-09-27 NOTE — ED Notes (Signed)
Patient refuses offer of wheelchair.

## 2017-09-27 NOTE — ED Triage Notes (Signed)
Pt reports that she woke up around 0130 with abdominal pain and around 0200 started vomiting. Pt is ambulatory to triage and is currently experiencing abdominal pain that radiated throughout her abdomen. Pt is otherwise in NAD.

## 2017-09-27 NOTE — ED Notes (Signed)
Pt pacing in room, states pain is 10/10 in abd.  Pt up to bathroom without assistance.  Pt with family at bedside.

## 2017-09-27 NOTE — ED Notes (Signed)
Pt given ginger ale and crackers, pt states that her ride is on the way. Will continue to monitor for further patient needs.

## 2017-09-27 NOTE — ED Notes (Signed)
This RN to bedside at this time, introduced self to patient and family. Pt placed back on the monitor. Pt states pain 6/10. Requesting something to eat, specifically broth due to her not having had anything to eat. This RN explained will have to speak with MD regarding. Pt noted to be alert but drowsy, VSS. Will continue to monitor for further patient needs.

## 2017-11-22 ENCOUNTER — Emergency Department
Admission: EM | Admit: 2017-11-22 | Discharge: 2017-11-22 | Disposition: A | Discharge status: Home / Self Care | Payer: No Typology Code available for payment source | Attending: Emergency Medicine | Admitting: Emergency Medicine

## 2017-11-22 ENCOUNTER — Emergency Department: Payer: No Typology Code available for payment source

## 2017-11-22 ENCOUNTER — Other Ambulatory Visit: Payer: Self-pay

## 2017-11-22 ENCOUNTER — Encounter: Payer: Self-pay | Admitting: Emergency Medicine

## 2017-11-22 DIAGNOSIS — W1842XA Slipping, tripping and stumbling without falling due to stepping into hole or opening, initial encounter: Secondary | ICD-10-CM | POA: Insufficient documentation

## 2017-11-22 DIAGNOSIS — S93402A Sprain of unspecified ligament of left ankle, initial encounter: Secondary | ICD-10-CM | POA: Insufficient documentation

## 2017-11-22 DIAGNOSIS — Y939 Activity, unspecified: Secondary | ICD-10-CM | POA: Insufficient documentation

## 2017-11-22 DIAGNOSIS — Z87891 Personal history of nicotine dependence: Secondary | ICD-10-CM | POA: Insufficient documentation

## 2017-11-22 DIAGNOSIS — Y998 Other external cause status: Secondary | ICD-10-CM | POA: Insufficient documentation

## 2017-11-22 DIAGNOSIS — Y929 Unspecified place or not applicable: Secondary | ICD-10-CM | POA: Insufficient documentation

## 2017-11-22 MED ORDER — MELOXICAM 15 MG PO TABS: 15.0000 mg | ORAL_TABLET | Freq: Every day | ORAL | 2 refills | Status: AC

## 2017-11-22 NOTE — ED Notes (Signed)
Ankle stirrup splint applied with ace wrap, crutches given and use of explained. Pt verbalized understanding.

## 2017-11-22 NOTE — ED Triage Notes (Signed)
L ankle pain. Twisted in a whole last night.

## 2017-11-22 NOTE — ED Provider Notes (Signed)
[facility] Emergency Department Provider Note  ____________________________________________   First MD Initiated Contact with Patient 11/22/17 1337     (approximate)  I have reviewed the triage vital signs and the nursing notes.   HISTORY  Chief Complaint Ankle Pain    HPI Rachel Cline is a 41 y.o. female states she stepped in a hole last night and her left ankle is swollen, it hurts to bear weight, she denies any numbness or tingling, she does not have any other injuries  History reviewed. No pertinent past medical history.  There are no active problems to display for this patient.   Past Surgical History:  Procedure Laterality Date  . CESAREAN SECTION    . TUBAL LIGATION      Prior to Admission medications   Medication Sig Start Date End Date Taking? Authorizing Provider  dicyclomine (BENTYL) 20 MG tablet Take 1 tablet (20 mg total) by mouth 3 (three) times daily as needed for spasms. 09/27/17 09/27/18  Schaevitz, Myra Rudeavid Matthew, MD  meloxicam (MOBIC) 15 MG tablet Take 1 tablet (15 mg total) by mouth daily. 11/22/17 11/22/18  Sherrie MustacheFisher, Roselyn BeringSusan W, PA  ondansetron (ZOFRAN) 4 MG tablet Take 1 tablet (4 mg total) by mouth daily as needed. 09/27/17   Schaevitz, Myra Rudeavid Matthew, MD  ranitidine (ZANTAC) 150 MG tablet Take 1 tablet (150 mg total) by mouth 2 (two) times daily. 09/27/17 09/27/18  Schaevitz, Myra Rudeavid Matthew, MD    Allergies Patient has no known allergies.  No family history on file.  Social History Social History   Tobacco Use  . Smoking status: Former Games developermoker  . Smokeless tobacco: Never Used  Substance Use Topics  . Alcohol use: Yes    Comment: occ  . Drug use: No    Review of Systems  Constitutional: No fever/chills Eyes: No visual changes. ENT: No sore throat. Respiratory: Denies cough Genitourinary: Negative for dysuria. Musculoskeletal: Negative for back pain.  Positive for left ankle pain Skin: Negative for  rash.    ____________________________________________   PHYSICAL EXAM:  VITAL SIGNS: ED Triage Vitals  Enc Vitals Group     BP 11/22/17 1328 (!) 126/93     Pulse Rate 11/22/17 1328 74     Resp 11/22/17 1328 20     Temp 11/22/17 1328 98.4 F (36.9 C)     Temp Source 11/22/17 1328 Oral     SpO2 11/22/17 1328 98 %     Weight 11/22/17 1329 256 lb (116.1 kg)     Height 11/22/17 1329 5\' 6"  (1.676 m)     Head Circumference --      Peak Flow --      Pain Score 11/22/17 1328 5     Pain Loc --      Pain Edu? --      Excl. in GC? --     Constitutional: Alert and oriented. Well appearing and in no acute distress. Eyes: Conjunctivae are normal.  Head: Atraumatic. Nose: No congestion/rhinnorhea. Cardiovascular: Normal rate, regular rhythm.  Heart sounds are normal Respiratory: Normal respiratory effort.  No retractions, lungs are clear to auscultation GU: deferred Musculoskeletal: FROM all extremities, warm and well perfused, left ankle is grossly swollen more so on the medial aspect, neurovascular is intact, small amount of bony tenderness noted over the medial malleolus Neurologic:  Normal speech and language.  Skin:  Skin is warm, dry and intact. No rash noted.  No abrasion is noted Psychiatric: Mood and affect are normal. Speech and behavior  are normal.  ____________________________________________   LABS (all labs ordered are listed, but only abnormal results are displayed)  Labs Reviewed - No data to display ____________________________________________   ____________________________________________  RADIOLOGY  X-ray of the left ankle is negative  ____________________________________________   PROCEDURES  Procedure(s) performed: Ace wrap, stirrup splint and crutches were applied and given to the patient      ____________________________________________   INITIAL IMPRESSION / ASSESSMENT AND PLAN / ED COURSE  Pertinent labs & imaging results that were  available during my care of the patient were reviewed by me and considered in my medical decision making (see chart for details).  Patient is a 41 year old female complaining of left ankle pain, her left ankle is swollen and tender, x-ray of the left ankle is negative, diagnosis sprained ankle, Ace wrap and stirrup splint were applied by tech, crutches were given to the patient, she is to take meloxicam 15 mg daily for pain and inflammation, she is to elevate and ice the ankle, she is to follow-up with orthopedics if she is not better in 5-7 days, she is to return if she is worsening, she states she understands treatment plan and will comply, she is to return if worsening      ____________________________________________   FINAL CLINICAL IMPRESSION(S) / ED DIAGNOSES  Final diagnoses:  Sprain of left ankle, unspecified ligament, initial encounter      NEW MEDICATIONS STARTED DURING THIS VISIT:  This SmartLink is deprecated. Use AVSMEDLIST instead to display the medication list for a patient.   Note:  This document was prepared using Dragon voice recognition software and may include unintentional dictation errors.    Faythe GheeFisher, Susan W, PA 11/22/17 1605    Merrily Brittleifenbark, Neil, MD 11/23/17 [phone]

## 2017-11-22 NOTE — Discharge Instructions (Signed)
Follow-up with orthopedics if you are not better in 5-7 days, use the Ace wrap and stirrup splint for extra support, you have also been given crutches to use if you are having pain with weightbearing, a prescription for meloxicam 15 mg daily was given, use ice and elevation to help with ankle swelling and pain, return to the emergency department if your worsening

## 2017-12-20 ENCOUNTER — Emergency Department
Admission: EM | Admit: 2017-12-20 | Discharge: 2017-12-20 | Disposition: A | Discharge status: Home / Self Care | Payer: Self-pay | Attending: Emergency Medicine | Admitting: Emergency Medicine

## 2017-12-20 ENCOUNTER — Encounter: Payer: Self-pay | Admitting: Emergency Medicine

## 2017-12-20 ENCOUNTER — Emergency Department
Admission: EM | Admit: 2017-12-20 | Discharge: 2017-12-20 | Disposition: A | Discharge status: Home / Self Care | Payer: No Typology Code available for payment source | Attending: Emergency Medicine | Admitting: Emergency Medicine

## 2017-12-20 ENCOUNTER — Other Ambulatory Visit: Payer: Self-pay

## 2017-12-20 DIAGNOSIS — N760 Acute vaginitis: Secondary | ICD-10-CM | POA: Insufficient documentation

## 2017-12-20 DIAGNOSIS — Z79899 Other long term (current) drug therapy: Secondary | ICD-10-CM | POA: Insufficient documentation

## 2017-12-20 DIAGNOSIS — A549 Gonococcal infection, unspecified: Secondary | ICD-10-CM | POA: Insufficient documentation

## 2017-12-20 DIAGNOSIS — Z87891 Personal history of nicotine dependence: Secondary | ICD-10-CM | POA: Insufficient documentation

## 2017-12-20 DIAGNOSIS — Z791 Long term (current) use of non-steroidal anti-inflammatories (NSAID): Secondary | ICD-10-CM | POA: Insufficient documentation

## 2017-12-20 DIAGNOSIS — B9689 Other specified bacterial agents as the cause of diseases classified elsewhere: Secondary | ICD-10-CM | POA: Insufficient documentation

## 2017-12-20 LAB — POCT PREGNANCY, URINE: PREG TEST UR: NEGATIVE

## 2017-12-20 LAB — URINALYSIS, COMPLETE (UACMP) WITH MICROSCOPIC
BACTERIA UA: NONE SEEN
Bilirubin Urine: NEGATIVE
Glucose, UA: NEGATIVE mg/dL
Hgb urine dipstick: NEGATIVE
Ketones, ur: NEGATIVE mg/dL
Nitrite: NEGATIVE
Protein, ur: NEGATIVE mg/dL
SPECIFIC GRAVITY, URINE: 1.024 (ref 1.005–1.030)
pH: 5 (ref 5.0–8.0)

## 2017-12-20 LAB — CHLAMYDIA/NGC RT PCR (ARMC ONLY)
Chlamydia Tr: NOT DETECTED
N gonorrhoeae: DETECTED — AB

## 2017-12-20 LAB — WET PREP, GENITAL
Sperm: NONE SEEN
Trich, Wet Prep: NONE SEEN
YEAST WET PREP: NONE SEEN

## 2017-12-20 MED ORDER — CEFTRIAXONE SODIUM 250 MG IJ SOLR
250.0000 mg | Freq: Once | INTRAMUSCULAR | Status: AC
  Administered 2017-12-20: 250 mg via INTRAMUSCULAR
  Filled 2017-12-20: qty 250

## 2017-12-20 MED ORDER — METRONIDAZOLE 500 MG PO TABS: 500.0000 mg | ORAL_TABLET | Freq: Two times a day (BID) | ORAL | 0 refills | Status: DC

## 2017-12-20 MED ORDER — AZITHROMYCIN 500 MG PO TABS
1000.0000 mg | ORAL_TABLET | Freq: Once | ORAL | Status: AC
  Administered 2017-12-20: 1000 mg via ORAL
  Filled 2017-12-20: qty 2

## 2017-12-20 NOTE — ED Provider Notes (Signed)
[facility] Emergency Department Provider Note  ____________________________________________   First MD Initiated Contact with Patient 12/20/17 1654     (approximate)  I have reviewed the triage vital signs and the nursing notes.   HISTORY  Chief Complaint STD tx   HPI Rachel Cline is a 42 y.o. female returns to the emergency department after I called asking her to return for treatment. Patient's GC and chlamydia culture returned positive for gonorrhea.   History reviewed. No pertinent past medical history.  There are no active problems to display for this patient.   Past Surgical History:  Procedure Laterality Date  . CESAREAN SECTION    . TUBAL LIGATION      Prior to Admission medications   Medication Sig Start Date End Date Taking? Authorizing Provider  dicyclomine (BENTYL) 20 MG tablet Take 1 tablet (20 mg total) by mouth 3 (three) times daily as needed for spasms. 09/27/17 09/27/18  Schaevitz, Myra Rudeavid Matthew, MD  meloxicam (MOBIC) 15 MG tablet Take 1 tablet (15 mg total) by mouth daily. 11/22/17 11/22/18  Fisher, Roselyn BeringSusan W, PA-C  metroNIDAZOLE (FLAGYL) 500 MG tablet Take 1 tablet (500 mg total) by mouth 2 (two) times daily. 12/20/17   Tommi RumpsSummers, Rhonda L, PA-C  ondansetron (ZOFRAN) 4 MG tablet Take 1 tablet (4 mg total) by mouth daily as needed. 09/27/17   Schaevitz, Myra Rudeavid Matthew, MD  ranitidine (ZANTAC) 150 MG tablet Take 1 tablet (150 mg total) by mouth 2 (two) times daily. 09/27/17 09/27/18  Schaevitz, Myra Rudeavid Matthew, MD    Allergies Patient has no known allergies.  History reviewed. No pertinent family history.  Social History Social History   Tobacco Use  . Smoking status: Former Games developermoker  . Smokeless tobacco: Never Used  Substance Use Topics  . Alcohol use: Yes    Comment: occ  . Drug use: No    Review of Systems Constitutional: No fever/chills Cardiovascular: Denies chest pain. Respiratory: Denies shortness of  breath. Gastrointestinal:  No nausea, no vomiting. Genitourinary: positive for dysuria. Musculoskeletal: Negative for back pain. Skin: Negative for rash. Neurological: Negative for headaches ___________________________________________   PHYSICAL EXAM:  VITAL SIGNS: ED Triage Vitals  Enc Vitals Group     BP 12/20/17 1647 (!) 178/100     Pulse Rate 12/20/17 1647 83     Resp 12/20/17 1647 18     Temp 12/20/17 1647 98.2 F (36.8 C)     Temp Source 12/20/17 1647 Oral     SpO2 12/20/17 1647 100 %     Weight 12/20/17 1640 255 lb (115.7 kg)     Height 12/20/17 1640 5\' 6"  (1.676 m)     Head Circumference --      Peak Flow --      Pain Score --      Pain Loc --      Pain Edu? --      Excl. in GC? --    Constitutional: Alert and oriented. Well appearing and in no acute distress. Tearful. Eyes: Conjunctivae are normal.  Head: Atraumatic. Respiratory: Normal respiratory effort.  No retractions.  Musculoskeletal: moves upper and lower extremities and normal gait.   Neurologic:  Normal speech and language. No gross focal neurologic deficits are appreciated. No gait instability. Skin:  Skin is warm, dry and intact. No rash noted. Psychiatric: Mood and affect are normal. Speech and behavior are normal.  ____________________________________________   LABS (all labs ordered are listed, but only abnormal results are displayed)  Labs Reviewed -  No data to display   PROCEDURES  Procedure(s) performed: None  Procedures  Critical Care performed: No  ____________________________________________   INITIAL IMPRESSION / ASSESSMENT AND PLAN / ED COURSE  Patient was given both Rocephin and Zithromax. She was given information this morning about the STD clinic at the [facility]. We discussed there are any concerns about syphilis or HIV that she should make an appointment to be seenat the health Department. ____________________________________________   FINAL CLINICAL  IMPRESSION(S) / ED DIAGNOSES  Final diagnoses:  Gonorrhea in female     ED Discharge Orders    None       Note:  This document was prepared using Dragon voice recognition software and may include unintentional dictation errors.    Tommi Rumps, PA-C 12/20/17 Derry Skill, MD 12/21/17 [phone]

## 2017-12-20 NOTE — ED Notes (Addendum)
Pt seen here in ED earlier. Called back due to positive std.

## 2017-12-20 NOTE — Discharge Instructions (Signed)
Follow up with the Health Department or OB/GYN of your choice if any continued problems. Take all of the medication until completely done

## 2017-12-20 NOTE — Discharge Instructions (Signed)
[facility]. Also have your blood pressure checked as it was elevated in the ED today. No sexual activity for 7 days. Also have partner treated.

## 2017-12-20 NOTE — ED Triage Notes (Signed)
Pt reports that for the last two days she is having increased urine and it is painful. It is hurting bilat in flank areas. Denies any blood in urine.

## 2017-12-20 NOTE — ED Provider Notes (Signed)
[facility] Emergency Department Provider Note  ____________________________________________   First MD Initiated Contact with Patient 12/20/17 [phone]     (approximate)  I have reviewed the triage vital signs and the nursing notes.   HISTORY  Chief Complaint Urinary Frequency   HPI Rachel Cline is a 42 y.o. female is here with complaint of dysuria for the last 2 days. Patient states that she also has had a vaginal discharge for 2 days. She denies any fever or chills. There's been no nausea or vomiting.    History reviewed. No pertinent past medical history.  There are no active problems to display for this patient.   Past Surgical History:  Procedure Laterality Date  . CESAREAN SECTION    . TUBAL LIGATION      Prior to Admission medications   Medication Sig Start Date End Date Taking? Authorizing Provider  dicyclomine (BENTYL) 20 MG tablet Take 1 tablet (20 mg total) by mouth 3 (three) times daily as needed for spasms. 09/27/17 09/27/18  Schaevitz, Myra Rudeavid Matthew, MD  meloxicam (MOBIC) 15 MG tablet Take 1 tablet (15 mg total) by mouth daily. 11/22/17 11/22/18  Fisher, Roselyn BeringSusan W, PA-C  metroNIDAZOLE (FLAGYL) 500 MG tablet Take 1 tablet (500 mg total) by mouth 2 (two) times daily. 12/20/17   Tommi RumpsSummers, Rhonda L, PA-C  ondansetron (ZOFRAN) 4 MG tablet Take 1 tablet (4 mg total) by mouth daily as needed. 09/27/17   Schaevitz, Myra Rudeavid Matthew, MD  ranitidine (ZANTAC) 150 MG tablet Take 1 tablet (150 mg total) by mouth 2 (two) times daily. 09/27/17 09/27/18  Schaevitz, Myra Rudeavid Matthew, MD    Allergies Patient has no known allergies.  History reviewed. No pertinent family history.  Social History Social History   Tobacco Use  . Smoking status: Former Games developermoker  . Smokeless tobacco: Never Used  Substance Use Topics  . Alcohol use: Yes    Comment: occ  . Drug use: No    Review of Systems Constitutional: No fever/chills Cardiovascular: Denies chest  pain. Respiratory: Denies shortness of breath. Gastrointestinal: No abdominal pain.  No nausea, no vomiting.   Genitourinary:  Positive for vaginal discharge. Positive for dysuria. Musculoskeletal: Negative for back pain. Skin: Negative for rash. Neurological: Negative for headaches.  ____________________________________________   PHYSICAL EXAM:  VITAL SIGNS: ED Triage Vitals [12/20/17 0757]  Enc Vitals Group     BP 136/80     Pulse Rate 92     Resp 20     Temp 98.6 F (37 C)     Temp Source Oral     SpO2 99 %     Weight 255 lb (115.7 kg)     Height 5\' 6"  (1.676 m)     Head Circumference      Peak Flow      Pain Score      Pain Loc      Pain Edu?      Excl. in GC?    Constitutional: Alert and oriented. Well appearing and in no acute distress. Eyes: Conjunctivae are normal.  Head: Atraumatic. Neck: No stridor.   Cardiovascular: Normal rate, regular rhythm. Grossly normal heart sounds.  Good peripheral circulation. Respiratory: Normal respiratory effort.  No retractions. Lungs CTAB. Gastrointestinal: Soft and nontender. No distention. No CVA tenderness. Genitourinary: moderate yellowish mucus vaginal discharge. No cervical motion tenderness. No adnexal masses or tenderness noted. External genitalia exam is unremarkable. Musculoskeletal: upper and lower extremities without any difficulty. Normal gait was noted. Neurologic:  Normal speech and  language. No gross focal neurologic deficits are appreciated. No gait instability. Skin:  Skin is warm, dry and intact. No rash noted. Psychiatric: Mood and affect are normal. Speech and behavior are normal.  ____________________________________________   LABS (all labs ordered are listed, but only abnormal results are displayed)  Labs Reviewed  CHLAMYDIA/NGC RT PCR ([facility] ONLY) - Abnormal; Notable for the following components:      Result Value   N gonorrhoeae DETECTED (*)    All other components within normal limits   WET PREP, GENITAL - Abnormal; Notable for the following components:   Clue Cells Wet Prep HPF POC PRESENT (*)    WBC, Wet Prep HPF POC MANY (*)    All other components within normal limits  URINALYSIS, COMPLETE (UACMP) WITH MICROSCOPIC - Abnormal; Notable for the following components:   Color, Urine YELLOW (*)    APPearance CLEAR (*)    Leukocytes, UA MODERATE (*)    Squamous Epithelial / LPF 6-30 (*)    All other components within normal limits  POC URINE PREG, ED  POCT PREGNANCY, URINE     PROCEDURES  Procedure(s) performed: None  Procedures  Critical Care performed: No  ____________________________________________   INITIAL IMPRESSION / ASSESSMENT AND PLAN / ED COURSE Patient was made aware that wet prep shows that she does have a bacterial vaginosis. Patient was given a prescription for Flagyl 500 mg twice a day for 7 days. Patient will follow up with OB/GYN of choice or the health department if any continued problems.  ----------------------------------------- 4:10 PM on 12/20/2017 ----------------------------------------- Results of gonorrhea and chlamydia test were present at the time of dictating file chart. Patient was called and notified that she would need to return to the emergency department for further treatment. Patient voiced that she would return today. Patient is positive for gonorrhea.  ____________________________________________   FINAL CLINICAL IMPRESSION(S) / ED DIAGNOSES  Final diagnoses:  BV (bacterial vaginosis)  Gonorrhea in female     ED Discharge Orders        Ordered    metroNIDAZOLE (FLAGYL) 500 MG tablet  2 times daily     12/20/17 6962       Note:  This document was prepared using Dragon voice recognition software and may include unintentional dictation errors.    Tommi Rumps, PA-C 12/20/17 1611    Governor Rooks, MD 12/20/17 [phone]

## 2017-12-20 NOTE — ED Notes (Signed)
Pt asked what all can be tested with her urine and is asking if we can test for STDs with her sample.

## 2017-12-20 NOTE — ED Triage Notes (Signed)
Called back for positive std

## 2018-09-11 IMAGING — CT CT ABD-PELV W/ CM
2 of 4 series · 16 of 46 positions shown, 18 images · IV contrast (iopamidol)
Comparison: CT abdomen and pelvis 03/19/2008.

CLINICAL DATA: Onset abdominal pain at [DATE] a.m. last night.
Vomiting since [DATE] a.m.

EXAM:
CT ABDOMEN AND PELVIS WITH CONTRAST
TECHNIQUE: Multidetector CT imaging of the abdomen and pelvis was performed
using the standard protocol following bolus administration of
intravenous contrast.
CONTRAST:  100 ml 96BSIT-6GG IOPAMIDOL (96BSIT-6GG) INJECTION 61%

[Series 2: routine abd/pel with · axial · 0.88mm/px · z∈[-926,-466]mm · 13 of 101 slices shown, 15 images]
[im 5/101  soft-tissue]
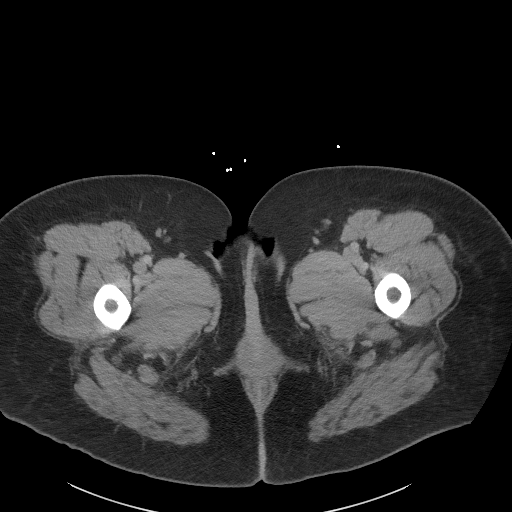
[im 5/101  bone]
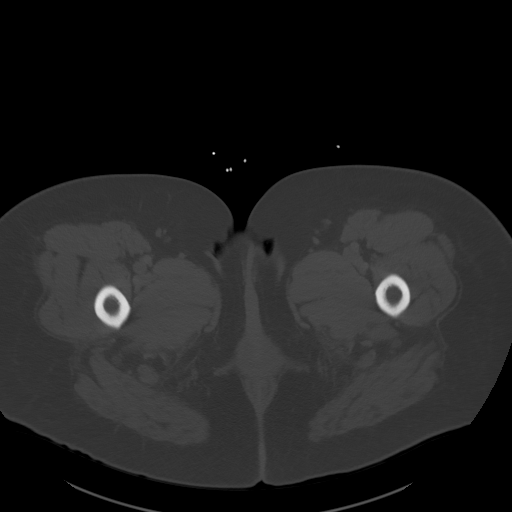
[im 13/101  soft-tissue]
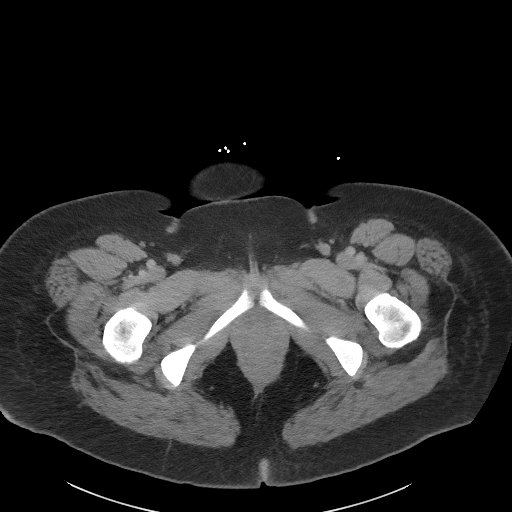
[im 21/101  soft-tissue]
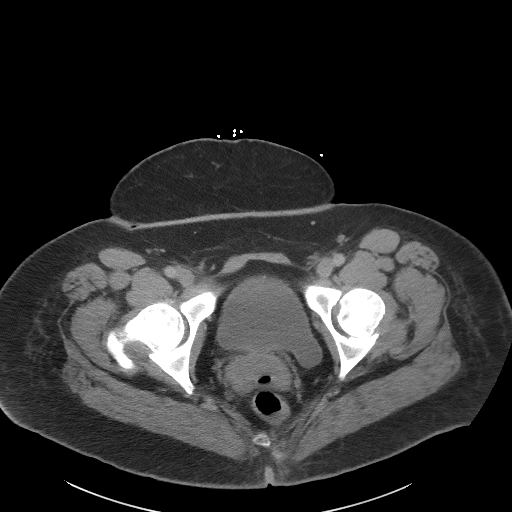
[im 29/101  soft-tissue]
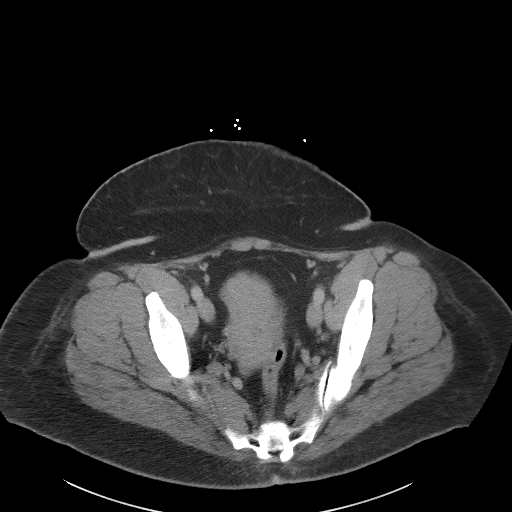
[im 37/101  soft-tissue]
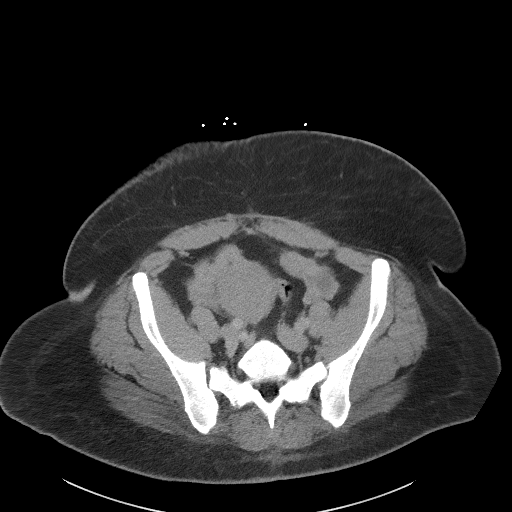
[im 45/101  soft-tissue]
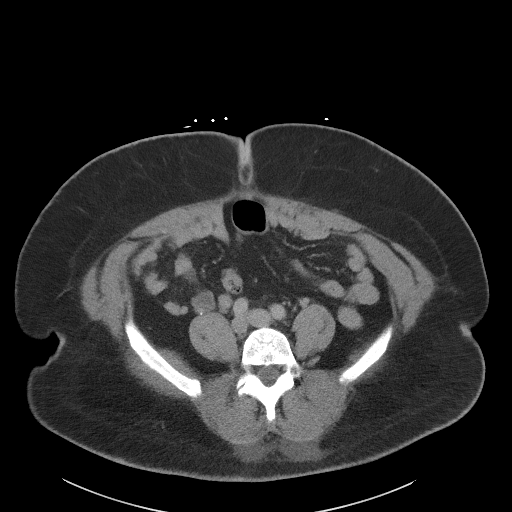
[im 53/101  soft-tissue]
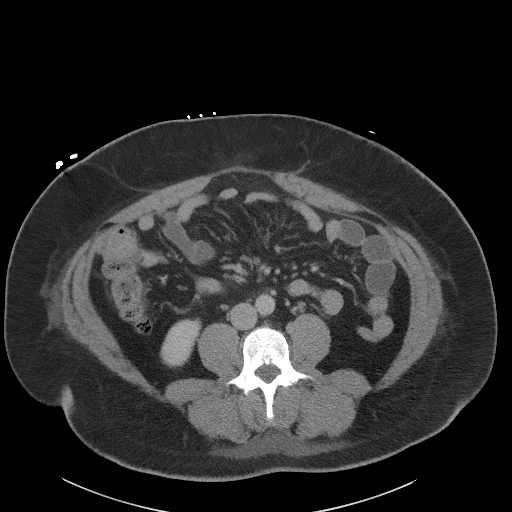
[im 57/101  soft-tissue]
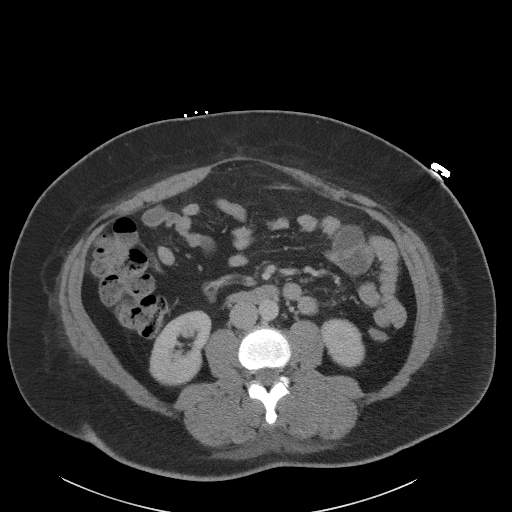
[im 65/101  soft-tissue]
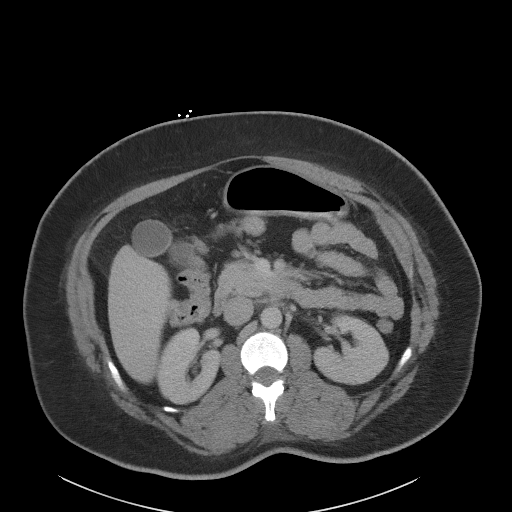
[im 65/101  bone]
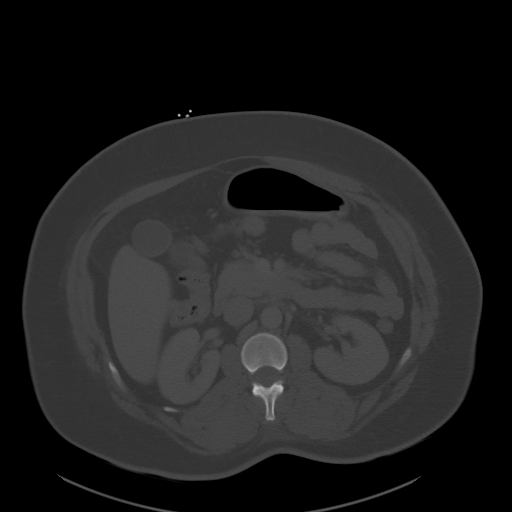
[im 73/101  soft-tissue]
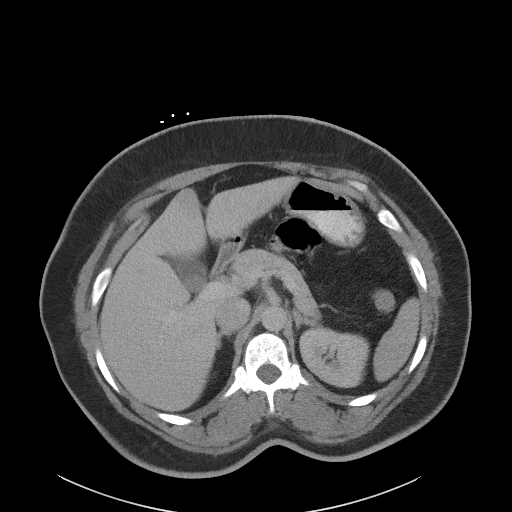
[im 81/101  soft-tissue]
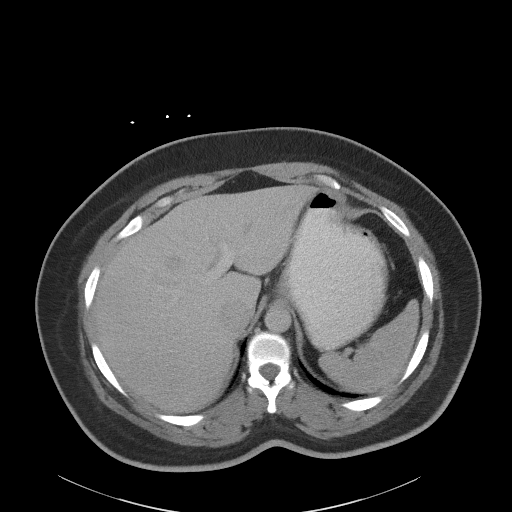
[im 89/101  soft-tissue]
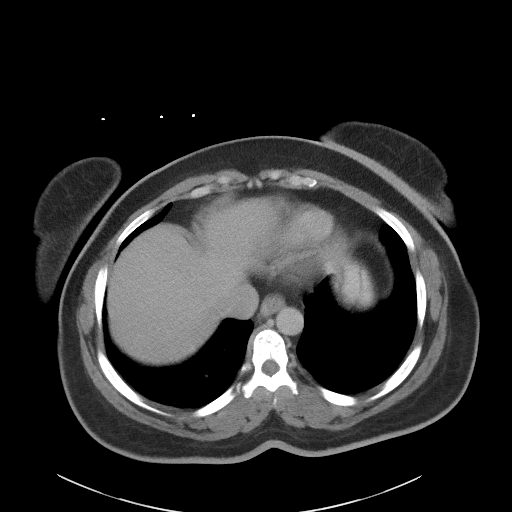
[im 97/101  soft-tissue]
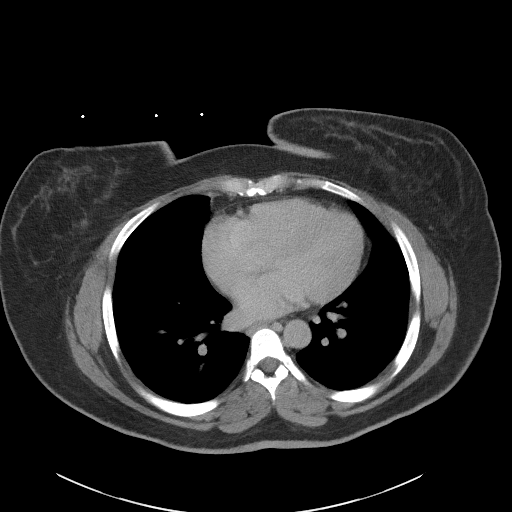

[Series 5: coronal st · coronal · 0.74mm/px · 3 of 93 slices shown]
[im 31/93  soft-tissue]
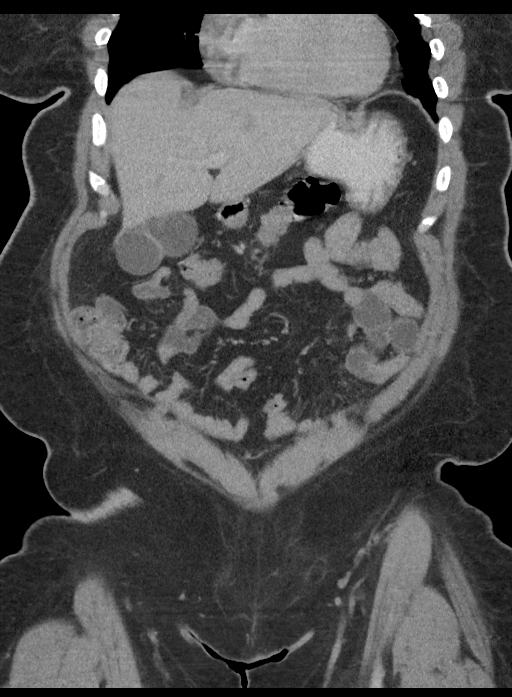
[im 41/93  soft-tissue]
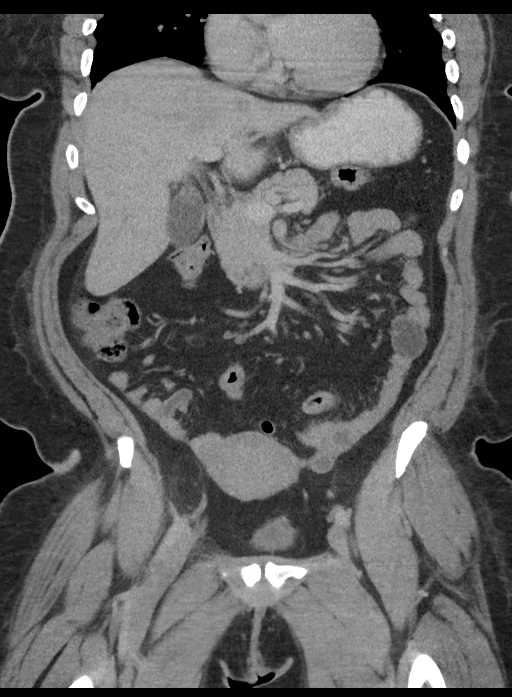
[im 52/93  soft-tissue]
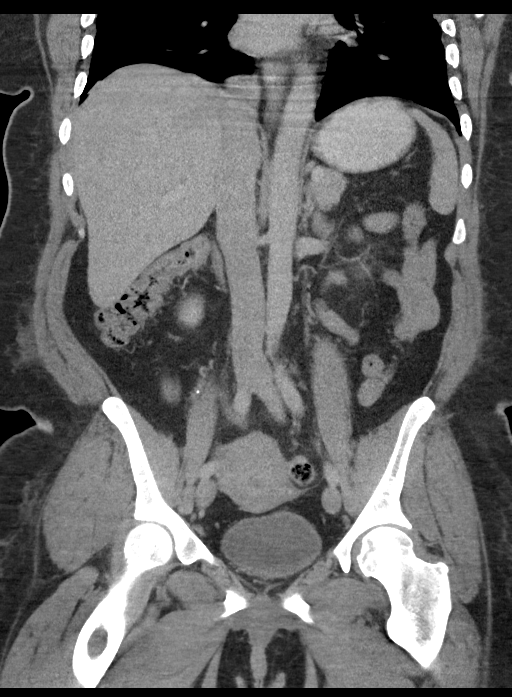

[16 of 46 positions shown; findings below may reference images not displayed]

FINDINGS: Lower chest: There is some dependent atelectasis in the lung bases.
Heart size is normal. No pleural or pericardial effusion.

Hepatobiliary: There appears to be a stone or some sludge in the
neck of the gallbladder. No CT evidence of cholecystitis is
identified. The liver and biliary tree are normal.

Pancreas: Unremarkable. No pancreatic ductal dilatation or
surrounding inflammatory changes.

Spleen: Normal in size without focal abnormality.

Adrenals/Urinary Tract: Adrenal glands are unremarkable. Kidneys are
normal, without renal calculi, focal lesion, or hydronephrosis.
Diverticulum off the posterior bladder on the left is unchanged. The
bladder otherwise appears normal.

Stomach/Bowel: Stomach is within normal limits. Appendix appears
normal. No evidence of bowel wall thickening, distention, or
inflammatory changes.

Vascular/Lymphatic: No significant vascular findings are present. No
enlarged abdominal or pelvic lymph nodes.

Reproductive: Uterus and bilateral adnexa are unremarkable.

Other: No ascites. Fat containing umbilical hernia is incidentally
noted

Musculoskeletal: Negative.
IMPRESSION: No acute abnormality abdomen or pelvis. No finding to explain the
patient's symptoms.

Possible sludge or stone in the neck of the gallbladder without
evidence of cholecystitis.

Small fat containing umbilical hernia.

## 2019-08-24 ENCOUNTER — Ambulatory Visit: Payer: Self-pay | Admitting: Family Medicine

## 2019-08-24 DIAGNOSIS — Z0289 Encounter for other administrative examinations: Secondary | ICD-10-CM

## 2019-12-23 ENCOUNTER — Ambulatory Visit: Payer: 59 | Attending: Internal Medicine

## 2019-12-23 DIAGNOSIS — Z20822 Contact with and (suspected) exposure to covid-19: Secondary | ICD-10-CM

## 2019-12-24 ENCOUNTER — Encounter (INDEPENDENT_AMBULATORY_CARE_PROVIDER_SITE_OTHER): Payer: Self-pay

## 2019-12-25 LAB — NOVEL CORONAVIRUS, NAA: SARS-CoV-2, NAA: NOT DETECTED

## 2020-01-03 ENCOUNTER — Ambulatory Visit: Payer: 59 | Attending: Internal Medicine

## 2020-01-03 DIAGNOSIS — Z20822 Contact with and (suspected) exposure to covid-19: Secondary | ICD-10-CM | POA: Insufficient documentation

## 2020-01-04 LAB — NOVEL CORONAVIRUS, NAA: SARS-CoV-2, NAA: NOT DETECTED

## 2020-02-01 ENCOUNTER — Ambulatory Visit: Payer: Self-pay | Attending: Internal Medicine

## 2020-02-01 DIAGNOSIS — Z20822 Contact with and (suspected) exposure to covid-19: Secondary | ICD-10-CM

## 2020-02-02 LAB — NOVEL CORONAVIRUS, NAA: SARS-CoV-2, NAA: NOT DETECTED

## 2021-10-08 DIAGNOSIS — E669 Obesity, unspecified: Secondary | ICD-10-CM | POA: Diagnosis not present

## 2021-10-08 DIAGNOSIS — D229 Melanocytic nevi, unspecified: Secondary | ICD-10-CM | POA: Diagnosis not present

## 2021-10-08 DIAGNOSIS — R300 Dysuria: Secondary | ICD-10-CM | POA: Diagnosis not present

## 2021-10-08 DIAGNOSIS — N920 Excessive and frequent menstruation with regular cycle: Secondary | ICD-10-CM | POA: Diagnosis not present

## 2021-10-08 DIAGNOSIS — M1711 Unilateral primary osteoarthritis, right knee: Secondary | ICD-10-CM | POA: Diagnosis not present

## 2021-10-08 DIAGNOSIS — B372 Candidiasis of skin and nail: Secondary | ICD-10-CM | POA: Diagnosis not present

## 2021-10-08 DIAGNOSIS — E559 Vitamin D deficiency, unspecified: Secondary | ICD-10-CM | POA: Diagnosis not present

## 2021-10-08 DIAGNOSIS — N938 Other specified abnormal uterine and vaginal bleeding: Secondary | ICD-10-CM | POA: Diagnosis not present

## 2021-10-15 DIAGNOSIS — D500 Iron deficiency anemia secondary to blood loss (chronic): Secondary | ICD-10-CM | POA: Diagnosis not present

## 2021-10-15 DIAGNOSIS — A64 Unspecified sexually transmitted disease: Secondary | ICD-10-CM | POA: Diagnosis not present

## 2021-10-15 DIAGNOSIS — E782 Mixed hyperlipidemia: Secondary | ICD-10-CM | POA: Diagnosis not present

## 2021-10-15 DIAGNOSIS — Z202 Contact with and (suspected) exposure to infections with a predominantly sexual mode of transmission: Secondary | ICD-10-CM | POA: Diagnosis not present

## 2021-10-15 DIAGNOSIS — N938 Other specified abnormal uterine and vaginal bleeding: Secondary | ICD-10-CM | POA: Diagnosis not present

## 2021-10-17 DIAGNOSIS — E65 Localized adiposity: Secondary | ICD-10-CM | POA: Diagnosis not present

## 2021-11-09 ENCOUNTER — Encounter: Payer: Self-pay | Admitting: Nurse Practitioner

## 2021-11-09 DIAGNOSIS — I10 Essential (primary) hypertension: Secondary | ICD-10-CM | POA: Insufficient documentation

## 2021-11-09 DIAGNOSIS — D649 Anemia, unspecified: Secondary | ICD-10-CM | POA: Insufficient documentation

## 2021-11-09 DIAGNOSIS — J449 Chronic obstructive pulmonary disease, unspecified: Secondary | ICD-10-CM | POA: Insufficient documentation

## 2021-11-09 DIAGNOSIS — E65 Localized adiposity: Secondary | ICD-10-CM | POA: Insufficient documentation

## 2021-11-13 ENCOUNTER — Ambulatory Visit: Payer: Self-pay | Admitting: Nurse Practitioner

## 2021-11-21 DIAGNOSIS — D2122 Benign neoplasm of connective and other soft tissue of left lower limb, including hip: Secondary | ICD-10-CM | POA: Diagnosis not present

## 2021-11-21 DIAGNOSIS — D229 Melanocytic nevi, unspecified: Secondary | ICD-10-CM | POA: Diagnosis not present

## 2021-11-21 DIAGNOSIS — I781 Nevus, non-neoplastic: Secondary | ICD-10-CM | POA: Diagnosis not present

## 2023-07-25 ENCOUNTER — Emergency Department
Admission: EM | Admit: 2023-07-25 | Discharge: 2023-07-25 | Disposition: A | Discharge status: Home / Self Care | Payer: BC Managed Care – PPO | Attending: Emergency Medicine | Admitting: Emergency Medicine

## 2023-07-25 ENCOUNTER — Encounter: Payer: Self-pay | Admitting: Emergency Medicine

## 2023-07-25 ENCOUNTER — Emergency Department: Payer: BC Managed Care – PPO

## 2023-07-25 ENCOUNTER — Other Ambulatory Visit: Payer: Self-pay

## 2023-07-25 ENCOUNTER — Ambulatory Visit: Payer: BC Managed Care – PPO | Admitting: Family Medicine

## 2023-07-25 DIAGNOSIS — R10816 Epigastric abdominal tenderness: Secondary | ICD-10-CM | POA: Diagnosis not present

## 2023-07-25 DIAGNOSIS — I10 Essential (primary) hypertension: Secondary | ICD-10-CM | POA: Diagnosis not present

## 2023-07-25 DIAGNOSIS — D72829 Elevated white blood cell count, unspecified: Secondary | ICD-10-CM | POA: Diagnosis not present

## 2023-07-25 DIAGNOSIS — R079 Chest pain, unspecified: Secondary | ICD-10-CM | POA: Insufficient documentation

## 2023-07-25 DIAGNOSIS — R9389 Abnormal findings on diagnostic imaging of other specified body structures: Secondary | ICD-10-CM | POA: Diagnosis not present

## 2023-07-25 DIAGNOSIS — R112 Nausea with vomiting, unspecified: Secondary | ICD-10-CM | POA: Insufficient documentation

## 2023-07-25 DIAGNOSIS — R0789 Other chest pain: Secondary | ICD-10-CM | POA: Diagnosis not present

## 2023-07-25 DIAGNOSIS — I517 Cardiomegaly: Secondary | ICD-10-CM | POA: Diagnosis not present

## 2023-07-25 DIAGNOSIS — K828 Other specified diseases of gallbladder: Secondary | ICD-10-CM | POA: Diagnosis not present

## 2023-07-25 DIAGNOSIS — R1013 Epigastric pain: Secondary | ICD-10-CM | POA: Insufficient documentation

## 2023-07-25 LAB — CBC WITH DIFFERENTIAL/PLATELET
Abs Immature Granulocytes: 0.04 10*3/uL (ref 0.00–0.07)
Basophils Absolute: 0.1 10*3/uL (ref 0.0–0.1)
Basophils Relative: 1 %
Eosinophils Absolute: 0.2 10*3/uL (ref 0.0–0.5)
Eosinophils Relative: 1 %
HCT: 39.3 % (ref 36.0–46.0)
Hemoglobin: 12.8 g/dL (ref 12.0–15.0)
Immature Granulocytes: 0 %
Lymphocytes Relative: 20 %
Lymphs Abs: 2.8 10*3/uL (ref 0.7–4.0)
MCH: 26.2 pg (ref 26.0–34.0)
MCHC: 32.6 g/dL (ref 30.0–36.0)
MCV: 80.5 fL (ref 80.0–100.0)
Monocytes Absolute: 0.9 10*3/uL (ref 0.1–1.0)
Monocytes Relative: 6 %
Neutro Abs: 10.1 10*3/uL — ABNORMAL HIGH (ref 1.7–7.7)
Neutrophils Relative %: 72 %
Platelets: 382 10*3/uL (ref 150–400)
RBC: 4.88 MIL/uL (ref 3.87–5.11)
RDW: 13 % (ref 11.5–15.5)
WBC: 14.1 10*3/uL — ABNORMAL HIGH (ref 4.0–10.5)
nRBC: 0 % (ref 0.0–0.2)

## 2023-07-25 LAB — URINALYSIS, ROUTINE W REFLEX MICROSCOPIC
Bilirubin Urine: NEGATIVE
Glucose, UA: NEGATIVE mg/dL
Ketones, ur: NEGATIVE mg/dL
Nitrite: NEGATIVE
Protein, ur: NEGATIVE mg/dL
Specific Gravity, Urine: 1.013 (ref 1.005–1.030)
pH: 8 (ref 5.0–8.0)

## 2023-07-25 LAB — COMPREHENSIVE METABOLIC PANEL
ALT: 26 U/L (ref 0–44)
AST: 22 U/L (ref 15–41)
Albumin: 4.2 g/dL (ref 3.5–5.0)
Alkaline Phosphatase: 80 U/L (ref 38–126)
Anion gap: 8 (ref 5–15)
BUN: 15 mg/dL (ref 6–20)
CO2: 24 mmol/L (ref 22–32)
Calcium: 9.1 mg/dL (ref 8.9–10.3)
Chloride: 105 mmol/L (ref 98–111)
Creatinine, Ser: 0.86 mg/dL (ref 0.44–1.00)
GFR, Estimated: >60 mL/min (ref >60)
Glucose, Bld: 109 mg/dL — ABNORMAL HIGH (ref 70–99)
Potassium: 3.9 mmol/L (ref 3.5–5.1)
Sodium: 137 mmol/L (ref 135–145)
Total Bilirubin: 0.6 mg/dL (ref 0.3–1.2)
Total Protein: 8.4 g/dL — ABNORMAL HIGH (ref 6.5–8.1)

## 2023-07-25 LAB — URINE DRUG SCREEN, QUALITATIVE (ARMC ONLY)
Amphetamines, Ur Screen: NOT DETECTED
Barbiturates, Ur Screen: NOT DETECTED
Benzodiazepine, Ur Scrn: NOT DETECTED
Cannabinoid 50 Ng, Ur ~~LOC~~: POSITIVE — AB
Cocaine Metabolite,Ur ~~LOC~~: NOT DETECTED
MDMA (Ecstasy)Ur Screen: NOT DETECTED
Methadone Scn, Ur: NOT DETECTED
Opiate, Ur Screen: NOT DETECTED
Phencyclidine (PCP) Ur S: NOT DETECTED
Tricyclic, Ur Screen: NOT DETECTED

## 2023-07-25 LAB — LIPASE, BLOOD: Lipase: 44 U/L (ref 11–51)

## 2023-07-25 LAB — TROPONIN I (HIGH SENSITIVITY)
Troponin I (High Sensitivity): 2 ng/L (ref <18)
Troponin I (High Sensitivity): 2 ng/L (ref <18)

## 2023-07-25 LAB — HCG, QUANTITATIVE, PREGNANCY: hCG, Beta Chain, Quant, S: 2 m[IU]/mL (ref <5)

## 2023-07-25 MED ORDER — ONDANSETRON HCL 4 MG/2ML IJ SOLN
4.0000 mg | Freq: Once | INTRAMUSCULAR | Status: AC
  Administered 2023-07-25: 4 mg via INTRAVENOUS
  Filled 2023-07-25: qty 2

## 2023-07-25 MED ORDER — FAMOTIDINE IN NACL 20-0.9 MG/50ML-% IV SOLN
20.0000 mg | Freq: Once | INTRAVENOUS | Status: AC
  Administered 2023-07-25: 20 mg via INTRAVENOUS
  Filled 2023-07-25: qty 50

## 2023-07-25 MED ORDER — ONDANSETRON 4 MG PO TBDP: 4.0000 mg | ORAL_TABLET | Freq: Three times a day (TID) | ORAL | 0 refills | Status: AC | PRN

## 2023-07-25 MED ORDER — SODIUM CHLORIDE 0.9 % IV SOLN
12.5000 mg | Freq: Once | INTRAVENOUS | Status: AC
  Administered 2023-07-25: 12.5 mg via INTRAVENOUS
  Filled 2023-07-25: qty 12.5

## 2023-07-25 MED ORDER — PANTOPRAZOLE SODIUM 40 MG PO TBEC: 40.0000 mg | DELAYED_RELEASE_TABLET | Freq: Every day | ORAL | 0 refills | Status: AC

## 2023-07-25 MED ORDER — DROPERIDOL 2.5 MG/ML IJ SOLN
2.5000 mg | Freq: Once | INTRAMUSCULAR | Status: AC
  Administered 2023-07-25: 2.5 mg via INTRAVENOUS
  Filled 2023-07-25: qty 2

## 2023-07-25 MED ORDER — MORPHINE SULFATE (PF) 4 MG/ML IV SOLN
4.0000 mg | Freq: Once | INTRAVENOUS | Status: AC
  Administered 2023-07-25: 4 mg via INTRAVENOUS
  Filled 2023-07-25: qty 1

## 2023-07-25 MED ORDER — ALUM & MAG HYDROXIDE-SIMETH 200-200-20 MG/5ML PO SUSP
30.0000 mL | Freq: Once | ORAL | Status: AC
  Administered 2023-07-25: 30 mL via ORAL
  Filled 2023-07-25: qty 30

## 2023-07-25 MED ORDER — LACTATED RINGERS IV BOLUS
1000.0000 mL | Freq: Once | INTRAVENOUS | Status: AC
  Administered 2023-07-25: 1000 mL via INTRAVENOUS

## 2023-07-25 MED ORDER — PROMETHAZINE HCL 25 MG RE SUPP: 25.0000 mg | Freq: Four times a day (QID) | RECTAL | 0 refills | Status: AC | PRN

## 2023-07-25 MED ORDER — IOHEXOL 350 MG/ML SOLN
80.0000 mL | Freq: Once | INTRAVENOUS | Status: AC | PRN
  Administered 2023-07-25: 80 mL via INTRAVENOUS

## 2023-07-25 NOTE — ED Provider Notes (Signed)
[facility] Provider Note    Event Date/Time   First MD Initiated Contact with Patient 07/25/23 [phone]     (approximate)   History   Chest Pain   HPI  Rachel Cline is a 47 y.o. female here with severe nausea, vomiting, abdominal pain.  The patient arrives in obvious distress, actively vomiting.  She states that she was at work overnight, when she developed acute onset of severe epigastric discomfort followed by nausea and vomiting.  She unable to keep anything down since then.  She is felt hot and chilled.  No other medical complaints.  Recent illnesses.  No suspicious food intake.  Recent medication changes.     Physical Exam   Triage Vital Signs: ED Triage Vitals  Encounter Vitals Group     BP 07/25/23 0654 (!) 137/114     Systolic BP Percentile --      Diastolic BP Percentile --      Pulse Rate 07/25/23 0654 72     Resp 07/25/23 0654 (!) 24     Temp 07/25/23 0654 97.9 F (36.6 C)     Temp Source 07/25/23 0654 Oral     SpO2 07/25/23 0654 99 %     Weight 07/25/23 0655 240 lb (108.9 kg)     Height 07/25/23 0655 5\' 6"  (1.676 m)     Head Circumference --      Peak Flow --      Pain Score 07/25/23 0655 10     Pain Loc --      Pain Education --      Exclude from Growth Chart --     Most recent vital signs: Vitals:   07/25/23 1145 07/25/23 1147  BP: 125/72   Pulse: 72 75  Resp:  18  Temp:    SpO2: 96% 94%     General: Awake, vomiting, in mild distress. CV:  Good peripheral perfusion.  Regular rate and rhythm.  No murmurs. Resp:  Normal work of breathing.  Lungs clear auscultation bilaterally. Abd:  No distention.  Moderate epigastric tenderness.  No significant rebound or guarding. Other:  Mildly dry mucous membranes.   ED Results / Procedures / Treatments   Labs (all labs ordered are listed, but only abnormal results are displayed) Labs Reviewed  CBC WITH DIFFERENTIAL/PLATELET - Abnormal; Notable for the following components:       Result Value   WBC 14.1 (*)    Neutro Abs 10.1 (*)    All other components within normal limits  COMPREHENSIVE METABOLIC PANEL - Abnormal; Notable for the following components:   Glucose, Bld 109 (*)    Total Protein 8.4 (*)    All other components within normal limits  URINE DRUG SCREEN, QUALITATIVE ([facility] ONLY) - Abnormal; Notable for the following components:   Cannabinoid 50 Ng, Ur [place] POSITIVE (*)    All other components within normal limits  URINALYSIS, ROUTINE W REFLEX MICROSCOPIC - Abnormal; Notable for the following components:   Color, Urine STRAW (*)    APPearance HAZY (*)    Hgb urine dipstick SMALL (*)    Leukocytes,Ua LARGE (*)    Bacteria, UA RARE (*)    All other components within normal limits  URINE CULTURE  LIPASE, BLOOD  HCG, QUANTITATIVE, PREGNANCY  TROPONIN I (HIGH SENSITIVITY)  TROPONIN I (HIGH SENSITIVITY)     EKG Normal sinus rhythm, ventricular at 68.  PR 180, QRS 105, QTc 434.  No acute ST elevation depression reviewed with  no evidence of acute ischemic infarct.   RADIOLOGY Chest x-ray: Mild cardiomegaly, otherwise unremarkable CT angio dissection:   I also independently reviewed and agree with radiologist interpretations.   PROCEDURES:  Critical Care performed: No  .1-3 Lead EKG Interpretation  Performed by: Shaune Pollack, MD Authorized by: Shaune Pollack, MD     Interpretation: normal     ECG rate:  70-90   ECG rate assessment: normal     Rhythm: sinus rhythm     Ectopy: none     Conduction: normal   Comments:     Indication: chest pain, n/v     MEDICATIONS ORDERED IN ED: Medications  droperidol (INAPSINE) 2.5 MG/ML injection 2.5 mg (2.5 mg Intravenous Given 07/25/23 0721)  lactated ringers bolus 1,000 mL (0 mLs Intravenous Stopped 07/25/23 0847)  ondansetron (ZOFRAN) injection 4 mg (4 mg Intravenous Given 07/25/23 0759)  famotidine (PEPCID) IVPB 20 mg premix (0 mg Intravenous Stopped 07/25/23 0847)  alum & mag hydroxide-simeth  (MAALOX/MYLANTA) 200-200-20 MG/5ML suspension 30 mL (30 mLs Oral Given 07/25/23 0758)  iohexol (OMNIPAQUE) 350 MG/ML injection 80 mL (80 mLs Intravenous Contrast Given 07/25/23 0851)  morphine (PF) 4 MG/ML injection 4 mg (4 mg Intravenous Given 07/25/23 0906)  promethazine (PHENERGAN) 12.5 mg in sodium chloride 0.9 % 50 mL IVPB (0 mg Intravenous Stopped 07/25/23 1112)     IMPRESSION / MDM / ASSESSMENT AND PLAN / ED COURSE  I reviewed the triage vital signs and the nursing notes.                              Differential diagnosis includes, but is not limited to, gastroparesis, CHS/cyclical vomiting, food borne illness, obstruction, cholecystitis, pancreatitis, enteritis  Patient's presentation is most consistent with acute presentation with potential threat to life or bodily function.  The patient is on the cardiac monitor to evaluate for evidence of arrhythmia and/or significant heart rate changes  47 yo F here with severe epigastric and chest pain. Pt in moderate distress on arrival, vomiting.  Patient hemodynamically stable.  CBC shows mild leukocytosis.  CMP unremarkable.  Urinalysis shows contaminant but she has no urinary symptoms.  UDS positive for cannabinoids.  CT abdomen/pelvis obtained, with dissection protocol given her initial severe hypertension and degree of discomfort and pain.  Patient has an incidental note of lesion in the gallbladder which I discussed with her as well as the need for follow-up ultrasound, otherwise, no acute finding.  Patient feels markedly improved after IV fluids and antiemetics.  She is not tolerating p.o.  Clinically, suspect possible cannabis hyperemesis versus foodborne illness.  No signs of emergent pathology.  She will follow-up with her PCP for the ultrasound and further evaluation.  No apparent emergent indication for admission at this time.   FINAL CLINICAL IMPRESSION(S) / ED DIAGNOSES   Final diagnoses:  Nausea and vomiting, unspecified vomiting type      Rx / DC Orders   ED Discharge Orders          Ordered    ondansetron (ZOFRAN-ODT) 4 MG disintegrating tablet  Every 8 hours PRN        07/25/23 1135    promethazine (PHENERGAN) 25 MG suppository  Every 6 hours PRN        07/25/23 1135    pantoprazole (PROTONIX) 40 MG tablet  Daily        07/25/23 1135  Note:  This document was prepared using Dragon voice recognition software and may include unintentional dictation errors.   Shaune Pollack, MD 07/25/23 [phone]

## 2023-07-25 NOTE — Progress Notes (Deleted)
New patient visit   Patient: Rachel Cline   DOB: Aug 22, 1976   47 y.o. Female  MRN: [id number] Visit Date: 07/25/2023  Today's healthcare provider: Ronnald Ramp, MD   No chief complaint on file.  Subjective    Rachel Cline is a 47 y.o. female who presents today as a new patient to establish care.      Discussed the use of AI scribe software for clinical note transcription with the patient, who gave verbal consent to proceed.  History of Present Illness              No past medical history on file. Past Surgical History:  Procedure Laterality Date   CESAREAN SECTION     TUBAL LIGATION     No family status information on file.   No family history on file. Social History   Socioeconomic History   Marital status: Single    Spouse name: Not on file   Number of children: Not on file   Years of education: Not on file   Highest education level: Some college, no degree  Occupational History   Not on file  Tobacco Use   Smoking status: Former   Smokeless tobacco: Never  Substance and Sexual Activity   Alcohol use: Yes    Comment: occ   Drug use: No   Sexual activity: Not on file  Other Topics Concern   Not on file  Social History Narrative   Not on file   Social Determinants of Health   Financial Resource Strain: Low Risk  (07/22/2023)   Overall Financial Resource Strain (CARDIA)    Difficulty of Paying Living Expenses: Not hard at all  Food Insecurity: Patient Declined (07/22/2023)   Hunger Vital Sign    Worried About Running Out of Food in the Last Year: Patient declined    Ran Out of Food in the Last Year: Patient declined  Transportation Needs: No Transportation Needs (07/22/2023)   PRAPARE - Administrator, Civil Service (Medical): No    Lack of Transportation (Non-Medical): No  Physical Activity: Sufficiently Active (07/22/2023)   Exercise Vital Sign    Days of Exercise per Week: 5 days    Minutes of Exercise per Session:  30 min  Stress: Stress Concern Present (07/22/2023)   [employer] of Occupational Health - Occupational Stress Questionnaire    Feeling of Stress : To some extent  Social Connections: Moderately Integrated (07/22/2023)   Social Connection and Isolation Panel [NHANES]    Frequency of Communication with Friends and Family: More than three times a week    Frequency of Social Gatherings with Friends and Family: Twice a week    Attends Religious Services: 1 to 4 times per year    Active Member of [employer] or Organizations: No    Attends Engineer, structural: Not on file    Marital Status: Married    Facility-Administered Medications Prior to Visit  Medication Dose Route Frequency Provider   alum & mag hydroxide-simeth (MAALOX/MYLANTA) 200-200-20 MG/5ML suspension 30 mL  30 mL Oral Once Shaune Pollack, MD   famotidine (PEPCID) IVPB 20 mg premix  20 mg Intravenous Once Shaune Pollack, MD   ondansetron [facility]) injection 4 mg  4 mg Intravenous Once Shaune Pollack, MD   Outpatient Medications Prior to Visit  Medication Sig   dicyclomine (BENTYL) 20 MG tablet Take 1 tablet (20 mg total) by mouth 3 (three) times daily as needed for spasms.   metroNIDAZOLE (FLAGYL) 500  MG tablet Take 1 tablet (500 mg total) by mouth 2 (two) times daily.   ondansetron (ZOFRAN) 4 MG tablet Take 1 tablet (4 mg total) by mouth daily as needed.   ranitidine (ZANTAC) 150 MG tablet Take 1 tablet (150 mg total) by mouth 2 (two) times daily.   No Known Allergies  Immunization History  Administered Date(s) Administered   Influenza,inj,Quad PF,6+ Mos 11/02/2019    Health Maintenance  Topic Date Due   DTaP/Tdap/Td (1 - Tdap) Never done   COVID-19 Vaccine (1 - 2023-24 season) Never done   Colonoscopy  Never done   INFLUENZA VACCINE  07/17/2023   PAP SMEAR-Modifier  11/01/2024   Hepatitis C Screening  Completed   HIV Screening  Completed   HPV VACCINES  Aged Out    Patient Care Team: Patient, No Pcp  Per as PCP - General (General Practice)  Review of Systems  {Insert previous labs (optional):23779} {See past labs  Heme  Chem  Endocrine  Serology  Results Review (optional):1}   Objective    There were no vitals taken for this visit. {Insert last BP/Wt (optional):23777}{See vitals history (optional):1}    Depression Screen     No data to display         Results for orders placed or performed during the hospital encounter of 07/25/23  CBC with Differential  Result Value Ref Range   WBC 14.1 (H) 4.0 - 10.5 K/uL   RBC 4.88 3.87 - 5.11 MIL/uL   Hemoglobin 12.8 12.0 - 15.0 g/dL   HCT 21.3 08.6 - 57.8 %   MCV 80.5 80.0 - 100.0 fL   MCH 26.2 26.0 - 34.0 pg   MCHC 32.6 30.0 - 36.0 g/dL   RDW 46.9 62.9 - 52.8 %   Platelets 382 150 - 400 K/uL   nRBC 0.0 0.0 - 0.2 %   Neutrophils Relative % 72 %   Neutro Abs 10.1 (H) 1.7 - 7.7 K/uL   Lymphocytes Relative 20 %   Lymphs Abs 2.8 0.7 - 4.0 K/uL   Monocytes Relative 6 %   Monocytes Absolute 0.9 0.1 - 1.0 K/uL   Eosinophils Relative 1 %   Eosinophils Absolute 0.2 0.0 - 0.5 K/uL   Basophils Relative 1 %   Basophils Absolute 0.1 0.0 - 0.1 K/uL   Immature Granulocytes 0 %   Abs Immature Granulocytes 0.04 0.00 - 0.07 K/uL  Comprehensive metabolic panel  Result Value Ref Range   Sodium 137 135 - 145 mmol/L   Potassium 3.9 3.5 - 5.1 mmol/L   Chloride 105 98 - 111 mmol/L   CO2 24 22 - 32 mmol/L   Glucose, Bld 109 (H) 70 - 99 mg/dL   BUN 15 6 - 20 mg/dL   Creatinine, Ser 4.13 0.44 - 1.00 mg/dL   Calcium 9.1 8.9 - 24.4 mg/dL   Total Protein 8.4 (H) 6.5 - 8.1 g/dL   Albumin 4.2 3.5 - 5.0 g/dL   AST 22 15 - 41 U/L   ALT 26 0 - 44 U/L   Alkaline Phosphatase 80 38 - 126 U/L   Total Bilirubin 0.6 0.3 - 1.2 mg/dL   GFR, Estimated >01 >02 mL/min   Anion gap 8 5 - 15  Lipase, blood  Result Value Ref Range   Lipase 44 11 - 51 U/L  Troponin I (High Sensitivity)  Result Value Ref Range   Troponin I (High Sensitivity) 2 <18 ng/L      Physical Exam ***  Assessment & Plan      Problem List Items Addressed This Visit   None                 No follow-ups on file.      Ronnald Ramp, MD  [facility] [phone] (phone) [phone] (fax)  [facility]

## 2023-07-25 NOTE — ED Triage Notes (Signed)
Pt to room 15 via w/c, appears uncomfortable, tearful, skin clammy and damp; st while at work began having left sided CP radiating down into abd acciomp by N/V; denies hx of same

## 2023-07-25 NOTE — ED Notes (Signed)
Pt verbalizes understanding of discharge instructions. Opportunity for questioning and answers were provided. Pt discharged from ED to home with brother.    

## 2023-07-25 NOTE — Discharge Instructions (Addendum)

## 2023-07-27 ENCOUNTER — Telehealth: Payer: BC Managed Care – PPO | Admitting: Nurse Practitioner

## 2023-07-27 DIAGNOSIS — B3731 Acute candidiasis of vulva and vagina: Secondary | ICD-10-CM | POA: Diagnosis not present

## 2023-07-27 MED ORDER — FLUCONAZOLE 150 MG PO TABS
150.0000 mg | ORAL_TABLET | ORAL | 0 refills | Status: AC
Start: 2023-07-27 — End: ?

## 2023-07-27 NOTE — Progress Notes (Signed)
Virtual Visit Consent   Rachel Cline, you are scheduled for a virtual visit with a [facility] provider today. Just as with appointments in the office, your consent must be obtained to participate. Your consent will be active for this visit and any virtual visit you may have with one of our providers in the next 365 days. If you have a MyChart account, a copy of this consent can be sent to you electronically.  As this is a virtual visit, video technology does not allow for your provider to perform a traditional examination. This may limit your provider's ability to fully assess your condition. If your provider identifies any concerns that need to be evaluated in person or the need to arrange testing (such as labs, EKG, etc.), we will make arrangements to do so. Although advances in technology are sophisticated, we cannot ensure that it will always work on either your end or our end. If the connection with a video visit is poor, the visit may have to be switched to a telephone visit. With either a video or telephone visit, we are not always able to ensure that we have a secure connection.  By engaging in this virtual visit, you consent to the provision of healthcare and authorize for your insurance to be billed (if applicable) for the services provided during this visit. Depending on your insurance coverage, you may receive a charge related to this service.  I need to obtain your verbal consent now. Are you willing to proceed with your visit today? Genetta Aubut has provided verbal consent on 07/27/2023 for a virtual visit (video or telephone). Claiborne Rigg, NP  Date: 07/27/2023 12:23 PM  Virtual Visit via Video Note   I, Claiborne Rigg, connected with  Lanny Hurst  ([id number], [birth year]) on 07/27/23 at 12:00 PM EDT by a video-enabled telemedicine application and verified that I am speaking with the correct person using two identifiers.  Location: Patient: Virtual Visit Location Patient:  Home Provider: Virtual Visit Location Provider: Home Office   I discussed the limitations of evaluation and management by telemedicine and the availability of in person appointments. The patient expressed understanding and agreed to proceed.    History of Present Illness: Rachel Cline is a 47 y.o. who identifies as a female who was assigned female at birth, and is being seen today for yeast vaginitis.   She endorses copious persistent non odorous vaginal discharge. Symptoms are similar to a previous yeast infection in the past. Over the counter monistat ineffective. Onset 2 weeks ago.   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 May repeat second dose of no improvement in 2-3 days, Disp: 2 tablet, Rfl: 0   dicyclomine (BENTYL) 20 MG tablet, Take 1 tablet (20 mg total) by mouth 3 (three) times daily as needed for spasms., Disp: 30 tablet, Rfl: 0   ondansetron (ZOFRAN) 4 MG tablet, Take 1 tablet (4 mg total) by mouth daily as needed. (Patient not taking: Reported on 07/25/2023), Disp: 10 tablet, Rfl: 0   ondansetron (ZOFRAN-ODT) 4 MG disintegrating tablet, Take 1 tablet (4 mg total) by mouth every 8 (eight) hours as needed for nausea  or vomiting., Disp: 20 tablet, Rfl: 0   pantoprazole (PROTONIX) 40 MG tablet, Take 1 tablet (40 mg total) by mouth daily for 14 days., Disp: 14 tablet, Rfl: 0   promethazine (PHENERGAN) 25 MG suppository, Place 1 suppository (25 mg total) rectally every 6 (six) hours as needed for refractory nausea / vomiting., Disp: 12 each, Rfl: 0   ranitidine (ZANTAC) 150 MG tablet, Take 1 tablet (150 mg total) by  mouth 2 (two) times daily., Disp: 30 tablet, Rfl: 0  Observations/Objective: Patient is well-developed, well-nourished in no acute distress.  Resting comfortably at home.  Head is normocephalic, atraumatic.  No labored breathing.  Speech is clear and coherent with logical content.  Patient is alert and oriented at baseline.    Assessment and Plan: 1. Yeast vaginitis - fluconazole (DIFLUCAN) 150 MG tablet; Take 1 tablet (150 mg total) by mouth every 3 (three) days. May repeat second dose of no improvement in 2-3 days  Dispense: 2 tablet; Refill: 0   Follow Up Instructions: I discussed the assessment and treatment plan with the patient. The patient was provided an opportunity to ask questions and all were answered. The patient agreed with the plan and demonstrated an understanding of the instructions.  A copy of instructions were sent to the patient via MyChart unless otherwise noted below.    The patient was advised to call back or seek an in-person evaluation if the symptoms worsen or if the condition fails to improve as anticipated.  Time:  I spent 11 minutes with the patient via telehealth technology discussing the above problems/concerns.    Claiborne Rigg, NP

## 2023-07-27 NOTE — Patient Instructions (Signed)
  Rachel Cline, thank you for joining Claiborne Rigg, NP for today's virtual visit.  While this provider is not your primary care provider (PCP), if your PCP is located in our provider database this encounter information will be shared with them immediately following your visit.   A [facility] MyChart account gives you access to today's visit and all your visits, tests, and labs performed at [facility] " click here if you don't have a [facility] MyChart account or go to [email or web address]  Consent: (Patient) Rachel Cline provided verbal consent for this virtual visit at the beginning of the encounter.  Current Medications:  Current Outpatient Medications:    fluconazole (DIFLUCAN) 150 MG tablet, Take 1 tablet (150 mg total) by mouth every 3 (three) days. May repeat second dose of no improvement in 2-3 days, Disp: 2 tablet, Rfl: 0   dicyclomine (BENTYL) 20 MG tablet, Take 1 tablet (20 mg total) by mouth 3 (three) times daily as needed for spasms., Disp: 30 tablet, Rfl: 0   ondansetron (ZOFRAN) 4 MG tablet, Take 1 tablet (4 mg total) by mouth daily as needed. (Patient not taking: Reported on 07/25/2023), Disp: 10 tablet, Rfl: 0   ondansetron (ZOFRAN-ODT) 4 MG disintegrating tablet, Take 1 tablet (4 mg total) by mouth every 8 (eight) hours as needed for nausea or vomiting., Disp: 20 tablet, Rfl: 0   pantoprazole (PROTONIX) 40 MG tablet, Take 1 tablet (40 mg total) by mouth daily for 14 days., Disp: 14 tablet, Rfl: 0   promethazine (PHENERGAN) 25 MG suppository, Place 1 suppository (25 mg total) rectally every 6 (six) hours as needed for refractory nausea / vomiting., Disp: 12 each, Rfl: 0   ranitidine (ZANTAC) 150 MG tablet, Take 1 tablet (150 mg total) by mouth 2 (two) times daily., Disp: 30 tablet, Rfl: 0   Medications ordered in this encounter:  Meds ordered this encounter  Medications   fluconazole (DIFLUCAN) 150 MG tablet    Sig: Take 1 tablet (150 mg total) by  mouth every 3 (three) days. May repeat second dose of no improvement in 2-3 days    Dispense:  2 tablet    Refill:  0    Order Specific Question:   Supervising Provider    Answer:   Merrilee Jansky [[id number]]     *If you need refills on other medications prior to your next appointment, please contact your pharmacy*  Follow-Up: Call back or seek an in-person evaluation if the symptoms worsen or if the condition fails to improve as anticipated.  [facility] [phone]   If you have been instructed to have an in-person evaluation today at a local Urgent Care facility, please use the link below. It will take you to a list of all of our available [facility], including address, phone number and hours of operation. Please do not delay care.  [facility]  If you or a family member do not have a primary care provider, use the link below to schedule a visit and establish care. When you choose a [facility] primary care physician or advanced practice provider, you gain a long-term partner in health. Find a Primary Care Provider  Learn more about [facility] in-office and virtual care options: [facility] - Get Care Now

## 2023-07-28 ENCOUNTER — Other Ambulatory Visit: Payer: Self-pay | Admitting: Nurse Practitioner

## 2023-07-28 MED ORDER — CEFADROXIL 500 MG PO CAPS: 500.0000 mg | ORAL_CAPSULE | Freq: Two times a day (BID) | ORAL | 0 refills | Status: AC

## 2023-07-28 NOTE — Consult Note (Signed)
Attempted to call Ms Basnett, she answered the phone and then hung up on me once I mentioned I was calling from [facility]. I attempted to call back twice and call was sent straight to voicemail both times. I left a voicemail with a call back number. I wanted to ask the patient if she was having any urinary symptoms for a UTI due to positive 30K of proteus mirabilis in urine and if patient had symptoms, plan was to prescribe antibiotics. She saw a NP via telehealth, I will message her with information and defer to her for treatment. Case was discussed with ED provider (Dr. Marisa Severin) and plan was to prescribe cefadroxil 500 mg BID x 5 days, if pt had urinary symptoms, but could not discuss with patient as she hung up and not answering phone calls.   Thanks, Paschal Dopp, PharmD, BCPS

## 2023-08-08 ENCOUNTER — Ambulatory Visit: Payer: BC Managed Care – PPO

## 2023-08-08 DIAGNOSIS — I10 Essential (primary) hypertension: Secondary | ICD-10-CM | POA: Diagnosis not present

## 2023-08-08 DIAGNOSIS — R82998 Other abnormal findings in urine: Secondary | ICD-10-CM | POA: Diagnosis not present

## 2023-08-08 DIAGNOSIS — N898 Other specified noninflammatory disorders of vagina: Secondary | ICD-10-CM | POA: Diagnosis not present

## 2023-09-18 DIAGNOSIS — N760 Acute vaginitis: Secondary | ICD-10-CM | POA: Diagnosis not present

## 2023-09-18 DIAGNOSIS — N898 Other specified noninflammatory disorders of vagina: Secondary | ICD-10-CM | POA: Diagnosis not present

## 2024-03-24 ENCOUNTER — Telehealth: Payer: Self-pay | Admitting: Surgical

## 2024-03-24 NOTE — Telephone Encounter (Signed)
 Pt calling to request a medication. Oxycodone

## 2024-06-02 ENCOUNTER — Ambulatory Visit: Payer: Self-pay | Admitting: Nurse Practitioner

## 2024-06-02 ENCOUNTER — Encounter: Payer: Self-pay | Admitting: Nurse Practitioner

## 2024-06-02 DIAGNOSIS — A599 Trichomoniasis, unspecified: Secondary | ICD-10-CM | POA: Insufficient documentation

## 2024-06-02 DIAGNOSIS — Z113 Encounter for screening for infections with a predominantly sexual mode of transmission: Secondary | ICD-10-CM

## 2024-06-02 LAB — WET PREP FOR TRICH, YEAST, CLUE
Clue Cell Exam: NEGATIVE
Trichomonas Exam: NEGATIVE
Yeast Exam: NEGATIVE

## 2024-06-02 LAB — HM HEPATITIS C SCREENING LAB: HM Hepatitis Screen: NEGATIVE

## 2024-06-02 LAB — HM HIV SCREENING LAB: HM HIV Screening: NEGATIVE

## 2024-06-02 NOTE — Progress Notes (Signed)
 [facility] Department STI clinic [address], [address] [place] [address] Main phone: [phone]  STI screening visit  Subjective:  Pheonix Wisby is a 48 y.o. female being seen today for an STI screening visit. The patient reports they do not have symptoms.    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 female who presents to the office today requesting asymptomaticSTI testing. She reports being seen at an outside clinic about 3 weeks ago and testing positive for trich. She stated at the time she had symptoms of abnormal vaginal discharge with odor. She reports being given medication in pill form that she completed and has not had sex since. She states her symptoms have resolved at this time.   Patient indicates 1 female partner in the last 2 months. She reports practicing vaginal and oral sex and sometimes uses condoms. Patient indicates an STI history of chlamydia and trichomoniasis. Patient reports last sex was 6 weeks ago. She indicates no use of  a contraception method and believes she is post-menopausal as she has not had a period in over 1 year.    See flowsheet for further details and programmatic requirements  Hyperlink available at the top of the signed note in blue.  Flow sheet content below:  Pregnancy Intention Screening Does the patient want to become pregnant in the next year?: No Does the patient's partner want to become pregnant in the next year?: No Would the patient like to discuss contraceptive options today?: No All Patients Anyone smoke around pt and/or pt's children?: Yes Anyone smoke inside pt's  house?: Yes Anyone smoke inside car?: Yes Anyone smoke inside the workplace?: Yes Reason For STD Screen STD Screening: Is asymptomatic Have you ever had an STD?: Yes STD Symptoms Denies all: Yes Risk Factors for Hep B Household, sexual, or needle sharing contact of a person infected with Hep B: No Sexual contact with a person who uses drugs not as prescribed?: No Currently or Ever used drugs not as prescribed: No HIV Positive: No PRep Patient: No Men who have sex with men: N/A Have Hepatitis C: No History of Incarceration: Yes History of Homeslessness?: Yes Anal sex following anal drug use?: No Risk Factors for Hep C Currently using drugs not as prescribed: No Sexual partner(s) currently using drugs as not prescribed: No History of drug use: Yes HIV Positive: No People with a history of incarceration: Yes People born between the years of 19 and 71: No Hepatitis Counseling Hep B Counseling: Counseled patient about increased risk of Hep B and recommendation for testing, Patient accepts testing for Hep B today Hep C Counseling: Counseled patient about increased risk of Hep C and recommendation for testing, Patient accepts testing for Hep C today Abuse History Has patient ever been abused physically?: No Has patient ever been abused sexually?: No Does patient feel they have a problem with Anxiety?: No Does patient feel they have a problem with Depression?: No Referral to Behavioral Health: N/A Counseling Patient counseled to use condoms with all sex: Condoms given RTC in 2-3 weeks for test results: Yes Clinic will call if test results abnormal before test result appt.: Yes Test results given  to patient Patient counseled to use condoms with all sex: Condoms given Contraception Wrap Up Current Method: No Method - Other Reason Reason for No Current Contraceptive Method at Intake ([facility] Only): Sterile for non-contraceptive reasons (Postmenopausal) End Method: No Method - Other  Reason Contraception Counseling Provided: No How was the end contraceptive method provided?: N/A  Screening for MPX risk: Does the patient have an unexplained rash? No Is the patient MSM? No Does the patient endorse multiple sex partners or anonymous sex partners? No Did the patient have close or sexual contact with a person diagnosed with MPX? No Has the patient traveled outside the [place]  where MPX is endemic? No Is there a high clinical suspicion for MPX-- evidenced by one of the following No  -Unlikely to be chickenpox  -Lymphadenopathy  -Rash that present in same phase of evolution on any given body part  STI screening history: Last HIV test per patient/review of record was No results found for: HMHIVSCREEN No results found for: HIV  Last HEPC test per patient/review of record was No results found for: HMHEPCSCREEN No components found for: HEPC   Last HEPB test per patient/review of record was No components found for: HMHEPBSCREEN   Fertility: Does the patient or their partner desires a pregnancy in the next year? No  Immunization History  Administered Date(s) Administered   Influenza,inj,Quad PF,6+ Mos 11/02/2019    The following portions of the patient's history were reviewed and updated as appropriate: allergies, current medications, past medical history, past social history, past surgical history and problem list.  Objective:  There were no vitals filed for this visit.  Physical Exam Nursing note reviewed.  Constitutional:      Appearance: Normal appearance.  HENT:     Head: Normocephalic.     Salivary Glands: Right salivary gland is not diffusely enlarged or tender. Left salivary gland is not diffusely enlarged or tender.     Mouth/Throat:     Lips: Pink. No lesions.     Mouth: Mucous membranes are moist.     Tongue: No lesions. Tongue does not deviate from midline.     Pharynx: Oropharynx is clear. Uvula midline. No oropharyngeal exudate or posterior  oropharyngeal erythema.     Tonsils: No tonsillar exudate.   Eyes:     General:        Right eye: No discharge.        Left eye: No discharge.   Pulmonary:     Effort: Pulmonary effort is normal.  Genitourinary:    Comments: Patient asymptomatic. Declines genital exam. Self-swabbing.  Lymphadenopathy:     Head:     Right side of head: No submental, submandibular, tonsillar, preauricular or posterior auricular adenopathy.     Left side of head: No submental, submandibular, tonsillar, preauricular or posterior auricular adenopathy.     Cervical: No cervical adenopathy.     Right cervical: No superficial or posterior cervical adenopathy.    Left cervical: No superficial or posterior cervical adenopathy.     Upper Body:     Right upper body: No supraclavicular or axillary adenopathy.     Left upper body: No supraclavicular or axillary adenopathy.   Skin:    General: Skin is warm and dry.     Comments: Skin tone appropriate for ethnicity. Assessed exposed areas only and back.    Neurological:     Mental Status: She is alert and oriented to person, place, and time.   Psychiatric:  Attention and Perception: Attention and perception normal.        Mood and Affect: Mood and affect normal.        Speech: Speech normal.        Behavior: Behavior normal. Behavior is cooperative.        Thought Content: Thought content normal.      Assessment and Plan:  Yvonne Stopher is a 48 y.o. female presenting to the [facility] Department for STI screening  1. Screening for venereal disease (Primary)  Wet prep negative in office today. Patient advised to return to this clinic for testing if symptoms return. Patient verbalized understanding and agreed with plan.     - Chlamydia/Gonorrhea [facility] - Gonococcus culture - HBV Antigen/Antibody [facility] - HIV/HCV [facility] - Syphilis Serology, [place] - WET PREP FOR TRICH, YEAST, CLUE   Patient does not have  STI symptoms Patient accepted the following screenings: oral GC culture, vaginal CT/GC swab, vaginal wet prep, HIV, RPR, Hep B, and Hep C Patient meets criteria for HepB screening? Yes. Ordered? yes Patient meets criteria for HepC screening? Yes. Ordered? yes Recommended condom use with all sex Discussed importance of condom use for STI prevention  Treat positive test results per standing order. Discussed time line for State Lab results and that patient will be called with positive results and encouraged patient to call if he had not heard in 2 weeks Recommended repeat testing in 3 months with positive results. Recommended returning for continued or worsening symptoms.   No follow-ups on file.  No future appointments.  Total time with patient 15 minutes.   Merleen Stare, NP

## 2024-06-02 NOTE — Progress Notes (Signed)
 Pt is here for STD screening , Wet prep results reviewed with pt, no treatment required per standing order. Condoms Given. Sonda Primes, RN.

## 2024-06-03 LAB — HBV ANTIGEN/ANTIBODY STATE LAB
Hep B Core Total Ab: NONREACTIVE
Hep B S Ab: NONREACTIVE
Hepatitis B Surface Antigen: NONREACTIVE

## 2024-06-08 LAB — GONOCOCCUS CULTURE

## 2024-06-11 ENCOUNTER — Encounter: Payer: Self-pay | Admitting: Nurse Practitioner

## 2024-10-18 ENCOUNTER — Encounter: Payer: Self-pay | Admitting: Radiology
